# Patient Record
Sex: Female | Born: 1965 | Race: Black or African American | Hispanic: No | Marital: Married | Smoking: Never smoker
Health system: Southern US, Community
[De-identification: ages and names within clinical notes are randomized; demographics above are authoritative.]

## PROBLEM LIST (undated history)

## (undated) DIAGNOSIS — G4730 Sleep apnea, unspecified: Secondary | ICD-10-CM

## (undated) DIAGNOSIS — G4700 Insomnia, unspecified: Secondary | ICD-10-CM

## (undated) DIAGNOSIS — Z9889 Other specified postprocedural states: Secondary | ICD-10-CM

## (undated) DIAGNOSIS — I10 Essential (primary) hypertension: Secondary | ICD-10-CM

## (undated) DIAGNOSIS — F419 Anxiety disorder, unspecified: Secondary | ICD-10-CM

## (undated) HISTORY — DX: Insomnia, unspecified: G47.00

## (undated) HISTORY — DX: Sleep apnea, unspecified: G47.30

## (undated) HISTORY — PX: TONSILLECTOMY: SUR1361

## (undated) HISTORY — DX: Anxiety disorder, unspecified: F41.9

## (undated) HISTORY — DX: Essential (primary) hypertension: I10

---

## 2003-07-03 HISTORY — PX: CHOLECYSTECTOMY: SHX55

## 2003-07-03 HISTORY — PX: GASTRIC BYPASS: SHX52

## 2008-07-02 HISTORY — PX: OTHER SURGICAL HISTORY: SHX169

## 2017-02-19 DIAGNOSIS — D123 Benign neoplasm of transverse colon: Secondary | ICD-10-CM | POA: Diagnosis not present

## 2017-12-06 ENCOUNTER — Encounter: Payer: Self-pay | Admitting: Gastroenterology

## 2017-12-06 DIAGNOSIS — K5900 Constipation, unspecified: Secondary | ICD-10-CM | POA: Diagnosis not present

## 2017-12-06 DIAGNOSIS — I10 Essential (primary) hypertension: Secondary | ICD-10-CM | POA: Diagnosis not present

## 2017-12-06 DIAGNOSIS — G4700 Insomnia, unspecified: Secondary | ICD-10-CM | POA: Diagnosis not present

## 2017-12-06 DIAGNOSIS — F320 Major depressive disorder, single episode, mild: Secondary | ICD-10-CM | POA: Diagnosis not present

## 2017-12-06 DIAGNOSIS — M2011 Hallux valgus (acquired), right foot: Secondary | ICD-10-CM | POA: Diagnosis not present

## 2017-12-16 ENCOUNTER — Encounter: Payer: Self-pay | Admitting: Internal Medicine

## 2018-01-01 DIAGNOSIS — G4700 Insomnia, unspecified: Secondary | ICD-10-CM | POA: Diagnosis not present

## 2018-01-01 DIAGNOSIS — K5900 Constipation, unspecified: Secondary | ICD-10-CM | POA: Diagnosis not present

## 2018-01-01 DIAGNOSIS — I10 Essential (primary) hypertension: Secondary | ICD-10-CM | POA: Diagnosis not present

## 2018-01-01 DIAGNOSIS — M2011 Hallux valgus (acquired), right foot: Secondary | ICD-10-CM | POA: Diagnosis not present

## 2018-01-03 ENCOUNTER — Other Ambulatory Visit (HOSPITAL_BASED_OUTPATIENT_CLINIC_OR_DEPARTMENT_OTHER): Payer: Self-pay

## 2018-01-03 DIAGNOSIS — G4700 Insomnia, unspecified: Secondary | ICD-10-CM

## 2018-01-03 DIAGNOSIS — R0683 Snoring: Secondary | ICD-10-CM

## 2018-03-18 ENCOUNTER — Ambulatory Visit: Payer: Self-pay | Admitting: Gastroenterology

## 2018-04-01 DIAGNOSIS — Z0000 Encounter for general adult medical examination without abnormal findings: Secondary | ICD-10-CM | POA: Diagnosis not present

## 2018-04-01 DIAGNOSIS — Z23 Encounter for immunization: Secondary | ICD-10-CM | POA: Diagnosis not present

## 2018-06-09 ENCOUNTER — Telehealth: Payer: Self-pay | Admitting: *Deleted

## 2018-06-09 ENCOUNTER — Encounter

## 2018-06-09 ENCOUNTER — Encounter: Payer: Self-pay | Admitting: Gastroenterology

## 2018-06-09 ENCOUNTER — Encounter: Payer: Self-pay | Admitting: *Deleted

## 2018-06-09 ENCOUNTER — Ambulatory Visit: Payer: Federal, State, Local not specified - PPO | Admitting: Gastroenterology

## 2018-06-09 DIAGNOSIS — R143 Flatulence: Secondary | ICD-10-CM | POA: Diagnosis not present

## 2018-06-09 DIAGNOSIS — Z8371 Family history of colonic polyps: Secondary | ICD-10-CM

## 2018-06-09 DIAGNOSIS — Z800 Family history of malignant neoplasm of digestive organs: Secondary | ICD-10-CM | POA: Insufficient documentation

## 2018-06-09 DIAGNOSIS — Z1211 Encounter for screening for malignant neoplasm of colon: Secondary | ICD-10-CM

## 2018-06-09 MED ORDER — PEG 3350-KCL-NA BICARB-NACL 420 G PO SOLR: 4000.0000 mL | Freq: Once | ORAL | 0 refills | Status: AC

## 2018-06-09 NOTE — Patient Instructions (Signed)
Colonoscopy as scheduled. See separate instructions.   Make sure you have a few good bowel movements the week before your bowel prep starts. Use OTC laxative if needed.   Limit foods from the HIGH FODMAP food list provided. These foods can increase gas in your digestive system.

## 2018-06-09 NOTE — Progress Notes (Signed)
Primary Care Physician:  Bland, Veita, MD  Primary Gastroenterologist:  Michael Rourk, MD   Chief Complaint  Patient presents with  . Colonoscopy    ref by DR. BLAND, NEVER HAD A TCS. Hx of constipation and hx of Gastric Bypass     HPI:  Courtney Curry is a 52 y.o. female here at the request of Dr. Bland for colonoscopy.  She has never had a colonoscopy.  She has a history of constipation.  Mother and sister both have had colon polyps.  Maternal grandfather died with colon cancer at an advanced age.  Lately she has not had any problems with constipation.  Generally when she does she will take a Dulcolax after 2 to 3 days without a stool.  May be something she does once or twice a month.  More of a prevalent issue since her gastric bypass.  Denies any melena or rectal bleeding.  No upper GI symptoms.  Complains of increased gas.    Current Outpatient Medications  Medication Sig Dispense Refill  . Cyanocobalamin (HM SUPER VITAMIN B12) 2500 MCG CHEW Chew by mouth.    . lisinopril-hydrochlorothiazide (PRINZIDE,ZESTORETIC) 20-12.5 MG tablet Take 1 tablet by mouth daily.    . Nutritional Supp - Diet Aids (DIETERS DETOX PO) Take 1 tablet by mouth once.     No current facility-administered medications for this visit.     Allergies as of 06/09/2018 - Review Complete 06/09/2018  Allergen Reaction Noted  . Penicillins Rash 06/09/2018    Past Medical History:  Diagnosis Date  . Anxiety   . Hypertension   . Insomnia   . Sleep apnea     Past Surgical History:  Procedure Laterality Date  . CHOLECYSTECTOMY  2005   repaired hernia   . GASTRIC BYPASS  2005  . Gastric bypass revision  2010    Family History  Problem Relation Age of Onset  . Colon polyps Mother   . Colon polyps Sister        around age 60  . Colon cancer Maternal Grandfather        older than 60  . Heart attack Maternal Grandfather        from chemo  . Endometrial cancer Sister     Social History    Socioeconomic History  . Marital status: Married    Spouse name: Not on file  . Number of children: Not on file  . Years of education: Not on file  . Highest education level: Not on file  Occupational History  . Not on file  Social Needs  . Financial resource strain: Not on file  . Food insecurity:    Worry: Not on file    Inability: Not on file  . Transportation needs:    Medical: Not on file    Non-medical: Not on file  Tobacco Use  . Smoking status: Never Smoker  . Smokeless tobacco: Never Used  Substance and Sexual Activity  . Alcohol use: Yes    Comment: 2-3 TIMES A WEEK  . Drug use: Never  . Sexual activity: Yes    Partners: Male  Lifestyle  . Physical activity:    Days per week: Not on file    Minutes per session: Not on file  . Stress: Not on file  Relationships  . Social connections:    Talks on phone: Not on file    Gets together: Not on file    Attends religious service: Not on file    Active   member of club or organization: Not on file    Attends meetings of clubs or organizations: Not on file    Relationship status: Not on file  . Intimate partner violence:    Fear of current or ex partner: Not on file    Emotionally abused: Not on file    Physically abused: Not on file    Forced sexual activity: Not on file  Other Topics Concern  . Not on file  Social History Narrative  . Not on file      ROS:  General: Negative for anorexia, weight loss, fever, chills, fatigue, weakness. Eyes: Negative for vision changes.  ENT: Negative for hoarseness, difficulty swallowing , nasal congestion. CV: Negative for chest pain, angina, palpitations, dyspnea on exertion, peripheral edema.  Respiratory: Negative for dyspnea at rest, dyspnea on exertion, cough, sputum, wheezing.  GI: See history of present illness. GU:  Negative for dysuria, hematuria, urinary incontinence, urinary frequency, nocturnal urination.  MS: Negative for joint pain, low back pain.  Derm:  Negative for rash or itching.  Neuro: Negative for weakness, abnormal sensation, seizure, frequent headaches, memory loss, confusion.  Psych: Negative for anxiety, depression, suicidal ideation, hallucinations.  Endo: Negative for unusual weight change.  Heme: Negative for bruising or bleeding. Allergy: Negative for rash or hives.    Physical Examination:  BP 118/75   Pulse 71   Temp (!) 97.5 F (36.4 C) (Oral)   Ht 5' 5" (1.651 m)   Wt 200 lb 3.2 oz (90.8 kg)   BMI 33.32 kg/m    General: Well-nourished, well-developed in no acute distress.  Head: Normocephalic, atraumatic.   Eyes: Conjunctiva pink, no icterus. Mouth: Oropharyngeal mucosa moist and pink , no lesions erythema or exudate. Neck: Supple without thyromegaly, masses, or lymphadenopathy.  Lungs: Clear to auscultation bilaterally.  Heart: Regular rate and rhythm, no murmurs rubs or gallops.  Abdomen: Bowel sounds are normal, nontender, nondistended, no hepatosplenomegaly or masses, no abdominal bruits or    hernia , no rebound or guarding.   Rectal: deferred Extremities: No lower extremity edema. No clubbing or deformities.  Neuro: Alert and oriented x 4 , grossly normal neurologically.  Skin: Warm and dry, no rash or jaundice.   Psych: Alert and cooperative, normal mood and affect.  Labs: June 2019 Glucose 87, BUN 17, creatinine 1.02, TSH 1.84, total bilirubin 1.1, alkaline phosphatase 80, AST 22, ALT 12, albumin 4.5, hemoglobin A1c 5, hemoglobin 12.6, hematocrit 38.1, white blood cell count 4200, platelets 186,000.  Imaging Studies: No results found.   

## 2018-06-09 NOTE — H&P (View-Only) (Signed)
Primary Care Physician:  Renaye RakersBland, Veita, MD  Primary Gastroenterologist:  Roetta SessionsMichael Rourk, MD   Chief Complaint  Patient presents with  . Colonoscopy    ref by DR. BLAND, NEVER HAD A TCS. Hx of constipation and hx of Gastric Bypass     HPI:  Courtney Curry is a [age] y.o. female here at the request of Dr. Parke SimmersBland for colonoscopy.  She has never had a colonoscopy.  She has a history of constipation.  Mother and sister both have had colon polyps.  Maternal grandfather died with colon cancer at an advanced age.  Lately she has not had any problems with constipation.  Generally when she does she will take a Dulcolax after 2 to 3 days without a stool.  May be something she does once or twice a month.  More of a prevalent issue since her gastric bypass.  Denies any melena or rectal bleeding.  No upper GI symptoms.  Complains of increased gas.    Current Outpatient Medications  Medication Sig Dispense Refill  . Cyanocobalamin (HM SUPER VITAMIN B12) 2500 MCG CHEW Chew by mouth.    Marland Kitchen. lisinopril-hydrochlorothiazide (PRINZIDE,ZESTORETIC) 20-12.5 MG tablet Take 1 tablet by mouth daily.    . Nutritional Supp - Diet Aids (DIETERS DETOX PO) Take 1 tablet by mouth once.     No current facility-administered medications for this visit.     Allergies as of 06/09/2018 - Review Complete 06/09/2018  Allergen Reaction Noted  . Penicillins Rash 06/09/2018    Past Medical History:  Diagnosis Date  . Anxiety   . Hypertension   . Insomnia   . Sleep apnea     Past Surgical History:  Procedure Laterality Date  . CHOLECYSTECTOMY  2005   repaired hernia   . GASTRIC BYPASS  2005  . Gastric bypass revision  2010    Family History  Problem Relation Age of Onset  . Colon polyps Mother   . Colon polyps Sister        around age 54  . Colon cancer Maternal Grandfather        older than 60  . Heart attack Maternal Grandfather        from chemo  . Endometrial cancer Sister     Social History    Socioeconomic History  . Marital status: Married    Spouse name: Not on file  . Number of children: Not on file  . Years of education: Not on file  . Highest education level: Not on file  Occupational History  . Not on file  Social Needs  . Financial resource strain: Not on file  . Food insecurity:    Worry: Not on file    Inability: Not on file  . Transportation needs:    Medical: Not on file    Non-medical: Not on file  Tobacco Use  . Smoking status: Never Smoker  . Smokeless tobacco: Never Used  Substance and Sexual Activity  . Alcohol use: Yes    Comment: 2-3 TIMES A WEEK  . Drug use: Never  . Sexual activity: Yes    Partners: Male  Lifestyle  . Physical activity:    Days per week: Not on file    Minutes per session: Not on file  . Stress: Not on file  Relationships  . Social connections:    Talks on phone: Not on file    Gets together: Not on file    Attends religious service: Not on file    Active  member of club or organization: Not on file    Attends meetings of clubs or organizations: Not on file    Relationship status: Not on file  . Intimate partner violence:    Fear of current or ex partner: Not on file    Emotionally abused: Not on file    Physically abused: Not on file    Forced sexual activity: Not on file  Other Topics Concern  . Not on file  Social History Narrative  . Not on file      ROS:  General: Negative for anorexia, weight loss, fever, chills, fatigue, weakness. Eyes: Negative for vision changes.  ENT: Negative for hoarseness, difficulty swallowing , nasal congestion. CV: Negative for chest pain, angina, palpitations, dyspnea on exertion, peripheral edema.  Respiratory: Negative for dyspnea at rest, dyspnea on exertion, cough, sputum, wheezing.  GI: See history of present illness. GU:  Negative for dysuria, hematuria, urinary incontinence, urinary frequency, nocturnal urination.  MS: Negative for joint pain, low back pain.  Derm:  Negative for rash or itching.  Neuro: Negative for weakness, abnormal sensation, seizure, frequent headaches, memory loss, confusion.  Psych: Negative for anxiety, depression, suicidal ideation, hallucinations.  Endo: Negative for unusual weight change.  Heme: Negative for bruising or bleeding. Allergy: Negative for rash or hives.    Physical Examination:  BP 118/75   Pulse 71   Temp (!) 97.5 F (36.4 C) (Oral)   Ht 5\' 5"  (1.651 m)   Wt 200 lb 3.2 oz (90.8 kg)   BMI 33.32 kg/m    General: Well-nourished, well-developed in no acute distress.  Head: Normocephalic, atraumatic.   Eyes: Conjunctiva pink, no icterus. Mouth: Oropharyngeal mucosa moist and pink , no lesions erythema or exudate. Neck: Supple without thyromegaly, masses, or lymphadenopathy.  Lungs: Clear to auscultation bilaterally.  Heart: Regular rate and rhythm, no murmurs rubs or gallops.  Abdomen: Bowel sounds are normal, nontender, nondistended, no hepatosplenomegaly or masses, no abdominal bruits or    hernia , no rebound or guarding.   Rectal: deferred Extremities: No lower extremity edema. No clubbing or deformities.  Neuro: Alert and oriented x 4 , grossly normal neurologically.  Skin: Warm and dry, no rash or jaundice.   Psych: Alert and cooperative, normal mood and affect.  Labs: June 2019 Glucose 87, BUN 17, creatinine 1.02, TSH 1.84, total bilirubin 1.1, alkaline phosphatase 80, AST 22, ALT 12, albumin 4.5, hemoglobin A1c 5, hemoglobin 12.6, hematocrit 38.1, white blood cell count 4200, platelets 186,000.  Imaging Studies: No results found.

## 2018-06-09 NOTE — Telephone Encounter (Signed)
Pre-op scheduled for 06/27/18 at 2:15pm. Letter mailed. Called patient, someone answered and then hung up.

## 2018-06-12 ENCOUNTER — Encounter: Payer: Self-pay | Admitting: Gastroenterology

## 2018-06-12 NOTE — Assessment & Plan Note (Signed)
Family history of colon polyps, mother and sister, maternal grandfather had colon cancer at an advanced age.  Patient has never had a colonoscopy.  Given history of daily alcohol use, anxiety, plan for deep sedation.  I have discussed the risks, alternatives, benefits with regards to but not limited to the risk of reaction to medication, bleeding, infection, perforation and the patient is agreeable to proceed. Written consent to be obtained.

## 2018-06-12 NOTE — Progress Notes (Signed)
CC'D TO PCP °

## 2018-06-12 NOTE — Assessment & Plan Note (Signed)
Limit foods from the high FODMAP diet.

## 2018-06-17 NOTE — Patient Instructions (Signed)
Courtney Shropshirengela Knight Curry  06/17/2018     @PREFPERIOPPHARMACY @   Your procedure is scheduled on  07/03/2018 .  [facility] to [facility] at  1215  P.M.  Call this number if you have problems the morning of surgery:  [phone]   Remember:  Follow the diet and prep instructions given to you by Dr Luvenia Starchourk's office.                      Take these medicines the morning of surgery with A SIP OF WATER Lisinopril.    Do not wear jewelry, make-up or nail polish.  Do not wear lotions, powders, or perfumes, or deodorant.  Do not shave 48 hours prior to surgery.  Men may shave face and neck.  Do not bring valuables to the hospital.  [facility] any belongings or valuables.  Contacts, dentures or bridgework may not be worn into surgery.  Leave your suitcase in the car.  After surgery it may be brought to your room.  For patients admitted to the hospital, discharge time will be determined by your treatment team.  Patients discharged the day of surgery will not be allowed to drive home.   Name and phone number of your driver:   family Special instructions:  None  Please read over the following fact sheets that you were given. Anesthesia Post-op Instructions and Care and Recovery After Surgery       Colonoscopy, Adult A colonoscopy is an exam to look at the large intestine. It is done to check for problems, such as:  Lumps (tumors).  Growths (polyps).  Swelling (inflammation).  Bleeding.  What happens before the procedure? Eating and drinking Follow instructions from your doctor about eating and drinking. These instructions may include:  A few days before the procedure - follow a low-fiber diet. ? Avoid nuts. ? Avoid seeds. ? Avoid dried fruit. ? Avoid raw fruits. ? Avoid vegetables.  1-3 days before the procedure - follow a clear liquid diet. Avoid liquids that have red or purple dye. Drink only clear liquids, such as: ? Clear broth or  bouillon. ? Black coffee or tea. ? Clear juice. ? Clear soft drinks or sports drinks. ? Gelatin dessert. ? Popsicles.  On the day of the procedure - do not eat or drink anything during the 2 hours before the procedure.  Bowel prep If you were prescribed an oral bowel prep:  Take it as told by your doctor. Starting the day before your procedure, you will need to drink a lot of liquid. The liquid will cause you to poop (have bowel movements) until your poop is almost clear or light green.  If your skin or butt gets irritated from diarrhea, you may: ? Wipe the area with wipes that have medicine in them, such as adult wet wipes with aloe and vitamin E. ? Put something on your skin that soothes the area, such as petroleum jelly.  If you throw up (vomit) while drinking the bowel prep, take a break for up to 60 minutes. Then begin the bowel prep again. If you keep throwing up and you cannot take the bowel prep without throwing up, call your doctor.  General instructions  Ask your doctor about changing or stopping your normal medicines. This is important if you take diabetes medicines or blood thinners.  Plan to have someone take you home from the hospital or clinic.  What happens during the procedure?  An IV tube may be put into one of your veins.  You will be given medicine to help you relax (sedative).  To reduce your risk of infection: ? Your doctors will wash their hands. ? Your anal area will be washed with soap.  You will be asked to lie on your side with your knees bent.  Your doctor will get a long, thin, flexible tube ready. The tube will have a camera and a light on the end.  The tube will be put into your anus.  The tube will be gently put into your large intestine.  Air will be delivered into your large intestine to keep it open. You may feel some pressure or cramping.  The camera will be used to take photos.  A small tissue sample may be removed from your body to  be looked at under a microscope (biopsy). If any possible problems are found, the tissue will be sent to a lab for testing.  If small growths are found, your doctor may remove them and have them checked for cancer.  The tube that was put into your anus will be slowly removed. The procedure may vary among doctors and hospitals. What happens after the procedure?  Your doctor will check on you often until the medicines you were given have worn off.  Do not drive for 24 hours after the procedure.  You may have a small amount of blood in your poop.  You may pass gas.  You may have mild cramps or bloating in your belly (abdomen).  It is up to you to get the results of your procedure. Ask your doctor, or the department performing the procedure, when your results will be ready. This information is not intended to replace advice given to you by your health care provider. Make sure you discuss any questions you have with your health care provider. Document Released: 07/21/2010 Document Revised: 04/18/2016 Document Reviewed: 08/30/2015 Elsevier Interactive Patient Education  2017 [employer]  Colonoscopy, Adult, Care After This sheet gives you information about how to care for yourself after your procedure. Your health care provider may also give you more specific instructions. If you have problems or questions, contact your health care provider. What can I expect after the procedure? After the procedure, it is common to have:  A small amount of blood in your stool for 24 hours after the procedure.  Some gas.  Mild abdominal cramping or bloating.  Follow these instructions at home: General instructions   For the first 24 hours after the procedure: ? Do not drive or use machinery. ? Do not sign important documents. ? Do not drink alcohol. ? Do your regular daily activities at a slower pace than normal. ? Eat soft, easy-to-digest foods. ? Rest often.  Take over-the-counter or  prescription medicines only as told by your health care provider.  It is up to you to get the results of your procedure. Ask your health care provider, or the department performing the procedure, when your results will be ready. Relieving cramping and bloating  Try walking around when you have cramps or feel bloated.  Apply heat to your abdomen as told by your health care provider. Use a heat source that your health care provider recommends, such as a moist heat pack or a heating pad. ? Place a towel between your skin and the heat source. ? Leave the heat on for 20-30 minutes. ? Remove the heat if your  skin turns bright red. This is especially important if you are unable to feel pain, heat, or cold. You may have a greater risk of getting burned. Eating and drinking  Drink enough fluid to keep your urine clear or pale yellow.  Resume your normal diet as instructed by your health care provider. Avoid heavy or fried foods that are hard to digest.  Avoid drinking alcohol for as long as instructed by your health care provider. Contact a health care provider if:  You have blood in your stool 2-3 days after the procedure. Get help right away if:  You have more than a small spotting of blood in your stool.  You pass large blood clots in your stool.  Your abdomen is swollen.  You have nausea or vomiting.  You have a fever.  You have increasing abdominal pain that is not relieved with medicine. This information is not intended to replace advice given to you by your health care provider. Make sure you discuss any questions you have with your health care provider. Document Released: 01/31/2004 Document Revised: 03/12/2016 Document Reviewed: 08/30/2015 Elsevier Interactive Patient Education  2018 [place] Anesthesia is a term that refers to techniques, procedures, and medicines that help a person stay safe and comfortable during a medical procedure.  Monitored anesthesia care, or sedation, is one type of anesthesia. Your anesthesia specialist may recommend sedation if you will be having a procedure that does not require you to be unconscious, such as:  Cataract surgery.  A dental procedure.  A biopsy.  A colonoscopy.  During the procedure, you may receive a medicine to help you relax (sedative). There are three levels of sedation:  Mild sedation. At this level, you may feel awake and relaxed. You will be able to follow directions.  Moderate sedation. At this level, you will be sleepy. You may not remember the procedure.  Deep sedation. At this level, you will be asleep. You will not remember the procedure.  The more medicine you are given, the deeper your level of sedation will be. Depending on how you respond to the procedure, the anesthesia specialist may change your level of sedation or the type of anesthesia to fit your needs. An anesthesia specialist will monitor you closely during the procedure. Let your health care provider know about:  Any allergies you have.  All medicines you are taking, including vitamins, herbs, eye drops, creams, and over-the-counter medicines.  Any use of steroids (by mouth or as a cream).  Any problems you or family members have had with sedatives and anesthetic medicines.  Any blood disorders you have.  Any surgeries you have had.  Any medical conditions you have, such as sleep apnea.  Whether you are pregnant or may be pregnant.  Any use of cigarettes, alcohol, or street drugs. What are the risks? Generally, this is a safe procedure. However, problems may occur, including:  Getting too much medicine (oversedation).  Nausea.  Allergic reaction to medicines.  Trouble breathing. If this happens, a breathing tube may be used to help with breathing. It will be removed when you are awake and breathing on your own.  Heart trouble.  Lung trouble.  Before the procedure Staying  hydrated Follow instructions from your health care provider about hydration, which may include:  Up to 2 hours before the procedure - you may continue to drink clear liquids, such as water, clear fruit juice, black coffee, and plain tea.  Eating and drinking restrictions Follow  instructions from your health care provider about eating and drinking, which may include:  8 hours before the procedure - stop eating heavy meals or foods such as meat, fried foods, or fatty foods.  6 hours before the procedure - stop eating light meals or foods, such as toast or cereal.  6 hours before the procedure - stop drinking milk or drinks that contain milk.  2 hours before the procedure - stop drinking clear liquids.  Medicines Ask your health care provider about:  Changing or stopping your regular medicines. This is especially important if you are taking diabetes medicines or blood thinners.  Taking medicines such as aspirin and ibuprofen. These medicines can thin your blood. Do not take these medicines before your procedure if your health care provider instructs you not to.  Tests and exams  You will have a physical exam.  You may have blood tests done to show: ? How well your kidneys and liver are working. ? How well your blood can clot.  General instructions  Plan to have someone take you home from the hospital or clinic.  If you will be going home right after the procedure, plan to have someone with you for 24 hours.  What happens during the procedure?  Your blood pressure, heart rate, breathing, level of pain and overall condition will be monitored.  An IV tube will be inserted into one of your veins.  Your anesthesia specialist will give you medicines as needed to keep you comfortable during the procedure. This may mean changing the level of sedation.  The procedure will be performed. After the procedure  Your blood pressure, heart rate, breathing rate, and blood oxygen level  will be monitored until the medicines you were given have worn off.  Do not drive for 24 hours if you received a sedative.  You may: ? Feel sleepy, clumsy, or nauseous. ? Feel forgetful about what happened after the procedure. ? Have a sore throat if you had a breathing tube during the procedure. ? Vomit. This information is not intended to replace advice given to you by your health care provider. Make sure you discuss any questions you have with your health care provider. Document Released: 03/14/2005 Document Revised: 11/25/2015 Document Reviewed: 10/09/2015 Elsevier Interactive Patient Education  2018 [place], Care After These instructions provide you with information about caring for yourself after your procedure. Your health care provider may also give you more specific instructions. Your treatment has been planned according to current medical practices, but problems sometimes occur. Call your health care provider if you have any problems or questions after your procedure. What can I expect after the procedure? After your procedure, it is common to:  Feel sleepy for several hours.  Feel clumsy and have poor balance for several hours.  Feel forgetful about what happened after the procedure.  Have poor judgment for several hours.  Feel nauseous or vomit.  Have a sore throat if you had a breathing tube during the procedure.  Follow these instructions at home: For at least 24 hours after the procedure:   Do not: ? Participate in activities in which you could fall or become injured. ? Drive. ? Use heavy machinery. ? Drink alcohol. ? Take sleeping pills or medicines that cause drowsiness. ? Make important decisions or sign legal documents. ? Take care of children on your own.  Rest. Eating and drinking  Follow the diet that is recommended by your health care provider.  If  you vomit, drink water, juice, or soup when you can drink without  vomiting.  Make sure you have little or no nausea before eating solid foods. General instructions  Have a responsible adult stay with you until you are awake and alert.  Take over-the-counter and prescription medicines only as told by your health care provider.  If you smoke, do not smoke without supervision.  Keep all follow-up visits as told by your health care provider. This is important. Contact a health care provider if:  You keep feeling nauseous or you keep vomiting.  You feel light-headed.  You develop a rash.  You have a fever. Get help right away if:  You have trouble breathing. This information is not intended to replace advice given to you by your health care provider. Make sure you discuss any questions you have with your health care provider. Document Released: 10/09/2015 Document Revised: 02/08/2016 Document Reviewed: 10/09/2015 Elsevier Interactive Patient Education  [employer].

## 2018-06-27 ENCOUNTER — Encounter (HOSPITAL_COMMUNITY): Payer: Self-pay

## 2018-06-27 ENCOUNTER — Other Ambulatory Visit: Payer: Self-pay

## 2018-06-27 ENCOUNTER — Encounter (HOSPITAL_COMMUNITY)
Admission: RE | Admit: 2018-06-27 | Discharge: 2018-06-27 | Disposition: A | Discharge status: Home / Self Care | Payer: Federal, State, Local not specified - PPO | Source: Ambulatory Visit | Attending: Internal Medicine | Admitting: Internal Medicine

## 2018-06-27 DIAGNOSIS — Z01812 Encounter for preprocedural laboratory examination: Secondary | ICD-10-CM | POA: Diagnosis not present

## 2018-06-27 LAB — BASIC METABOLIC PANEL
Anion gap: 6 (ref 5–15)
BUN: 22 mg/dL — ABNORMAL HIGH (ref 6–20)
CHLORIDE: 108 mmol/L (ref 98–111)
CO2: 24 mmol/L (ref 22–32)
Calcium: 8.7 mg/dL — ABNORMAL LOW (ref 8.9–10.3)
Creatinine, Ser: 0.99 mg/dL (ref 0.44–1.00)
GFR calc non Af Amer: >60 mL/min (ref >60)
Glucose, Bld: 112 mg/dL — ABNORMAL HIGH (ref 70–99)
Potassium: 3.7 mmol/L (ref 3.5–5.1)
Sodium: 138 mmol/L (ref 135–145)

## 2018-06-27 LAB — HCG, SERUM, QUALITATIVE: Preg, Serum: NEGATIVE

## 2018-07-03 ENCOUNTER — Ambulatory Visit (HOSPITAL_COMMUNITY)
Admission: RE | Admit: 2018-07-03 | Discharge: 2018-07-03 | Disposition: A | Discharge status: Home / Self Care | Payer: Federal, State, Local not specified - PPO | Attending: Internal Medicine | Admitting: Internal Medicine

## 2018-07-03 ENCOUNTER — Encounter (HOSPITAL_COMMUNITY): Payer: Self-pay | Admitting: *Deleted

## 2018-07-03 ENCOUNTER — Ambulatory Visit (HOSPITAL_COMMUNITY): Payer: Federal, State, Local not specified - PPO | Admitting: Anesthesiology

## 2018-07-03 ENCOUNTER — Other Ambulatory Visit: Payer: Self-pay

## 2018-07-03 ENCOUNTER — Encounter (HOSPITAL_COMMUNITY)
Admission: RE | Disposition: A | Discharge status: Home / Self Care | Payer: Self-pay | Source: Home / Self Care | Attending: Internal Medicine

## 2018-07-03 DIAGNOSIS — G4700 Insomnia, unspecified: Secondary | ICD-10-CM | POA: Diagnosis not present

## 2018-07-03 DIAGNOSIS — Z800 Family history of malignant neoplasm of digestive organs: Secondary | ICD-10-CM | POA: Diagnosis not present

## 2018-07-03 DIAGNOSIS — Z8371 Family history of colonic polyps: Secondary | ICD-10-CM | POA: Insufficient documentation

## 2018-07-03 DIAGNOSIS — F419 Anxiety disorder, unspecified: Secondary | ICD-10-CM | POA: Insufficient documentation

## 2018-07-03 DIAGNOSIS — I10 Essential (primary) hypertension: Secondary | ICD-10-CM | POA: Diagnosis not present

## 2018-07-03 DIAGNOSIS — Z1211 Encounter for screening for malignant neoplasm of colon: Secondary | ICD-10-CM | POA: Insufficient documentation

## 2018-07-03 DIAGNOSIS — K5900 Constipation, unspecified: Secondary | ICD-10-CM | POA: Diagnosis not present

## 2018-07-03 DIAGNOSIS — Z9884 Bariatric surgery status: Secondary | ICD-10-CM | POA: Diagnosis not present

## 2018-07-03 DIAGNOSIS — G4730 Sleep apnea, unspecified: Secondary | ICD-10-CM | POA: Diagnosis not present

## 2018-07-03 DIAGNOSIS — Z79899 Other long term (current) drug therapy: Secondary | ICD-10-CM | POA: Diagnosis not present

## 2018-07-03 HISTORY — PX: COLONOSCOPY WITH PROPOFOL: SHX5780

## 2018-07-03 SURGERY — COLONOSCOPY WITH PROPOFOL
Anesthesia: General

## 2018-07-03 MED ORDER — LACTATED RINGERS IV SOLN
INTRAVENOUS | Status: DC
  Administered 2018-07-03: 13:00:00 via INTRAVENOUS

## 2018-07-03 MED ORDER — PROPOFOL 10 MG/ML IV BOLUS
INTRAVENOUS | Status: AC
  Filled 2018-07-03: qty 60

## 2018-07-03 MED ORDER — PROPOFOL 10 MG/ML IV BOLUS
INTRAVENOUS | Status: DC | PRN
  Administered 2018-07-03: 80 mg via INTRAVENOUS

## 2018-07-03 MED ORDER — CHLORHEXIDINE GLUCONATE CLOTH 2 % EX PADS: 6.0000 | MEDICATED_PAD | Freq: Once | CUTANEOUS | Status: DC

## 2018-07-03 MED ORDER — PROPOFOL 500 MG/50ML IV EMUL
INTRAVENOUS | Status: DC | PRN
  Administered 2018-07-03: 200 ug/kg/min via INTRAVENOUS
  Administered 2018-07-03: 150 ug/kg/min via INTRAVENOUS

## 2018-07-03 MED ORDER — PROPOFOL 10 MG/ML IV BOLUS
INTRAVENOUS | Status: AC
  Filled 2018-07-03: qty 20

## 2018-07-03 NOTE — Interval H&P Note (Signed)
History and Physical Interval Note:  07/03/2018 1:22 PM  Kiersti Grandpre Knight-Davis  has presented today for surgery, with the diagnosis of colon cancer screening, FH colon polyps  The various methods of treatment have been discussed with the patient and family. After consideration of risks, benefits and other options for treatment, the patient has consented to  Procedure(s) with comments: COLONOSCOPY WITH PROPOFOL (N/A) - 1:45pm as a surgical intervention .  The patient's history has been reviewed, patient examined, no change in status, stable for surgery.  I have reviewed the patient's chart and labs.  Questions were answered to the patient's satisfaction.     Miamarie Moll  No change.  Patient presents for first ever however screening colonoscopy.  The risks, benefits, limitations, alternatives and imponderables have been reviewed with the patient. Questions have been answered. All parties are agreeable.

## 2018-07-03 NOTE — Op Note (Signed)
[facility] Patient Name: Courtney Curry Procedure Date: 07/03/2018 1:27 PM MRN: [id number] Date of Birth: [birth year] Attending MD: Gennette Pac , MD CSN: [id number] Age: 53 Admit Type: Outpatient Procedure:                Colonoscopy Indications:              Colon cancer screening in patient at increased                            risk: Family history of colon polyps in multiple                            1st-degree relatives Providers:                Gennette Pac, MD, Jannett Celestine, RN, Edythe Clarity, Technician Referring MD:              Medicines:                Propofol per Anesthesia Complications:            No immediate complications. Estimated Blood Loss:     Estimated blood loss: none. Procedure:                Pre-Anesthesia Assessment:                           - Prior to the procedure, a History and Physical                            was performed, and patient medications and                            allergies were reviewed. The patient's tolerance of                            previous anesthesia was also reviewed. The risks                            and benefits of the procedure and the sedation                            options and risks were discussed with the patient.                            All questions were answered, and informed consent                            was obtained. Prior Anticoagulants: The patient has                            taken no previous anticoagulant or antiplatelet                            agents.  ASA Grade Assessment: II - A patient with                            mild systemic disease. After reviewing the risks                            and benefits, the patient was deemed in                            satisfactory condition to undergo the procedure.                           After obtaining informed consent, the colonoscope                            was passed under direct  vision. Throughout the                            procedure, the patient's blood pressure, pulse, and                            oxygen saturations were monitored continuously. The                            CF-[id number] ([id number] scope was introduced through                            the and advanced to the the cecum, identified by                            appendiceal orifice and ileocecal valve. The                            colonoscopy was performed without difficulty. The                            patient tolerated the procedure well. The quality                            of the bowel preparation was adequate. The                            ileocecal valve, appendiceal orifice, and rectum                            were photographed. The entire colon was well                            visualized. Scope In: 1:33:58 PM Scope Out: 1:49:18 PM Scope Withdrawal Time: 0 hours 7 minutes 45 seconds  Total Procedure Duration: 0 hours 15 minutes 20 seconds  Findings:      The perianal and digital rectal examinations were normal.      The colon (entire examined portion) appeared normal.  The retroflexed view of the distal rectum and anal verge was normal and       showed no anal or rectal abnormalities. Impression:               - The entire examined colon is normal.                           - The distal rectum and anal verge are normal on                            retroflexion view.                           - No specimens collected. Moderate Sedation:      Moderate (conscious) sedation was personally administered by an       anesthesia professional. The following parameters were monitored: oxygen       saturation, heart rate, blood pressure, respiratory rate, EKG, adequacy       of pulmonary ventilation, and response to care. Recommendation:           - Patient has a contact number available for                            emergencies. The signs and symptoms of potential                             delayed complications were discussed with the                            patient. Return to normal activities tomorrow.                            Written discharge instructions were provided to the                            patient.                           - Advance diet as tolerated.                           - Continue present medications.                           - Repeat colonoscopy in 5 years for screening                            purposes.                           - Return to GI office PRN. Procedure Code(s):        --- Professional ---                           [phone], Colonoscopy, flexible; diagnostic, including  collection of specimen(s) by brushing or washing,                            when performed (separate procedure) Diagnosis Code(s):        --- Professional ---                           Z83.71, Family history of colonic polyps CPT copyright 2018 American Medical Association. All rights reserved. The codes documented in this report are preliminary and upon coder review may  be revised to meet current compliance requirements. [name], MD Gennette Pacobert Michael Zondra Lawlor, MD 07/03/2018 1:55:42 PM This report has been signed electronically. Number of Addenda: 0

## 2018-07-03 NOTE — Anesthesia Procedure Notes (Signed)
Procedure Name: MAC Date/Time: 07/03/2018 1:27 PM Performed by: Vista Deck, CRNA Pre-anesthesia Checklist: Patient identified, Emergency Drugs available, Suction available, Timeout performed and Patient being monitored Patient Re-evaluated:Patient Re-evaluated prior to induction Oxygen Delivery Method: Nasal Cannula

## 2018-07-03 NOTE — Anesthesia Postprocedure Evaluation (Signed)
Anesthesia Post Note  Patient: Courtney Curry  Procedure(s) Performed: COLONOSCOPY WITH PROPOFOL (N/A )  Patient location during evaluation: PACU Anesthesia Type: General Level of consciousness: awake and alert and patient cooperative Pain management: satisfactory to patient Vital Signs Assessment: post-procedure vital signs reviewed and stable Respiratory status: spontaneous breathing Cardiovascular status: stable Postop Assessment: no apparent nausea or vomiting Anesthetic complications: no     Last Vitals:  Vitals:   07/03/18 1227  BP: 119/74  Pulse: 64  Resp: 19  Temp: 36.5 C  SpO2: 100%    Last Pain:  Vitals:   07/03/18 1329  TempSrc:   PainSc: 0-No pain                 Corie Allis

## 2018-07-03 NOTE — Transfer of Care (Signed)
Immediate Anesthesia Transfer of Care Note  Patient: Courtney Curry  Procedure(s) Performed: COLONOSCOPY WITH PROPOFOL (N/A )  Patient Location: PACU  Anesthesia Type:General  Level of Consciousness: awake, alert  and patient cooperative  Airway & Oxygen Therapy: Patient Spontanous Breathing  Post-op Assessment: Report given to RN and Post -op Vital signs reviewed and stable  Post vital signs: Reviewed and stable  Last Vitals:  Vitals Value Taken Time  BP    Temp    Pulse    Resp    SpO2      Last Pain:  Vitals:   07/03/18 1329  TempSrc:   PainSc: 0-No pain      Patients Stated Pain Goal: 6 (07/03/18 1227)  Complications: No apparent anesthesia complications

## 2018-07-03 NOTE — Anesthesia Preprocedure Evaluation (Signed)
Anesthesia Evaluation  Patient identified by MRN, date of birth, ID band Patient awake    Reviewed: Allergy & Precautions, H&P , NPO status , Patient's Chart, lab work & pertinent test results  Airway Mallampati: I  TM Distance: >3 FB Neck ROM: full    Dental no notable dental hx.    Pulmonary sleep apnea ,    Pulmonary exam normal breath sounds clear to auscultation       Cardiovascular Exercise Tolerance: Good hypertension, negative cardio ROS   Rhythm:regular Rate:Normal     Neuro/Psych PSYCHIATRIC DISORDERS Anxiety negative neurological ROS     GI/Hepatic negative GI ROS, Neg liver ROS,   Endo/Other  negative endocrine ROS  Renal/GU negative Renal ROS  negative genitourinary   Musculoskeletal   Abdominal   Peds  Hematology negative hematology ROS (+)   Anesthesia Other Findings   Reproductive/Obstetrics negative OB ROS                             Anesthesia Physical Anesthesia Plan  ASA: II  Anesthesia Plan: General   Post-op Pain Management:    Induction:   PONV Risk Score and Plan:   Airway Management Planned:   Additional Equipment:   Intra-op Plan:   Post-operative Plan:   Informed Consent: I have reviewed the patients History and Physical, chart, labs and discussed the procedure including the risks, benefits and alternatives for the proposed anesthesia with the patient or authorized representative who has indicated his/her understanding and acceptance.     Plan Discussed with: CRNA  Anesthesia Plan Comments:         Anesthesia Quick Evaluation

## 2018-07-03 NOTE — Discharge Instructions (Signed)
Colonoscopy Discharge Instructions  Read the instructions outlined below and refer to this sheet in the next few weeks. These discharge instructions provide you with general information on caring for yourself after you leave the hospital. Your doctor may also give you specific instructions. While your treatment has been planned according to the most current medical practices available, unavoidable complications occasionally occur. If you have any problems or questions after discharge, call Dr. Jena Gauss at [phone]. ACTIVITY  You may resume your regular activity, but move at a slower pace for the next 24 hours.   Take frequent rest periods for the next 24 hours.   Walking will help get rid of the air and reduce the bloated feeling in your belly (abdomen).   No driving for 24 hours (because of the medicine (anesthesia) used during the test).    Do not sign any important legal documents or operate any machinery for 24 hours (because of the anesthesia used during the test).  NUTRITION  Drink plenty of fluids.   You may resume your normal diet as instructed by your doctor.   Begin with a light meal and progress to your normal diet. Heavy or fried foods are harder to digest and may make you feel sick to your stomach (nauseated).   Avoid alcoholic beverages for 24 hours or as instructed.  MEDICATIONS  You may resume your normal medications unless your doctor tells you otherwise.  WHAT YOU CAN EXPECT TODAY  Some feelings of bloating in the abdomen.   Passage of more gas than usual.   Spotting of blood in your stool or on the toilet paper.  IF YOU HAD POLYPS REMOVED DURING THE COLONOSCOPY:  No aspirin products for 7 days or as instructed.   No alcohol for 7 days or as instructed.   Eat a soft diet for the next 24 hours.  FINDING OUT THE RESULTS OF YOUR TEST Not all test results are available during your visit. If your test results are not back during the visit, make an appointment  with your caregiver to find out the results. Do not assume everything is normal if you have not heard from your caregiver or the medical facility. It is important for you to follow up on all of your test results.  SEEK IMMEDIATE MEDICAL ATTENTION IF:  You have more than a spotting of blood in your stool.   Your belly is swollen (abdominal distention).   You are nauseated or vomiting.   You have a temperature over 101.   You have abdominal pain or discomfort that is severe or gets worse throughout the day.   Monitored Anesthesia Care, Care After These instructions provide you with information about caring for yourself after your procedure. Your health care provider may also give you more specific instructions. Your treatment has been planned according to current medical practices, but problems sometimes occur. Call your health care provider if you have any problems or questions after your procedure. What can I expect after the procedure? After your procedure, you may:  Feel sleepy for several hours.  Feel clumsy and have poor balance for several hours.  Feel forgetful about what happened after the procedure.  Have poor judgment for several hours.  Feel nauseous or vomit.  Have a sore throat if you had a breathing tube during the procedure. Follow these instructions at home: For at least 24 hours after the procedure:      Have a responsible adult stay with you. It is important to have  someone help care for you until you are awake and alert.  Rest as needed.  Do not: ? Participate in activities in which you could fall or become injured. ? Drive. ? Use heavy machinery. ? Drink alcohol. ? Take sleeping pills or medicines that cause drowsiness. ? Make important decisions or sign legal documents. ? Take care of children on your own. Eating and drinking  Follow the diet that is recommended by your health care provider.  If you vomit, drink water, juice, or soup when you can  drink without vomiting.  Make sure you have little or no nausea before eating solid foods. General instructions  Take over-the-counter and prescription medicines only as told by your health care provider.  If you have sleep apnea, surgery and certain medicines can increase your risk for breathing problems. Follow instructions from your health care provider about wearing your sleep device: ? Anytime you are sleeping, including during daytime naps. ? While taking prescription pain medicines, sleeping medicines, or medicines that make you drowsy.  If you smoke, do not smoke without supervision.  Keep all follow-up visits as told by your health care provider. This is important. Contact a health care provider if:  You keep feeling nauseous or you keep vomiting.  You feel light-headed.  You develop a rash.  You have a fever. Get help right away if:  You have trouble breathing. Summary  For several hours after your procedure, you may feel sleepy and have poor judgment.  Have a responsible adult stay with you for at least 24 hours or until you are awake and alert. This information is not intended to replace advice given to you by your health care provider. Make sure you discuss any questions you have with your health care provider. Document Released: 10/09/2015 Document Revised: 02/01/2017 Document Reviewed: 10/09/2015 Elsevier Interactive Patient Education  [employer]2019 [employer]   Repeat colonoscopy in 5 years

## 2018-07-09 ENCOUNTER — Encounter (HOSPITAL_COMMUNITY): Payer: Self-pay | Admitting: Internal Medicine

## 2018-08-26 DIAGNOSIS — I10 Essential (primary) hypertension: Secondary | ICD-10-CM | POA: Diagnosis not present

## 2018-08-26 DIAGNOSIS — E538 Deficiency of other specified B group vitamins: Secondary | ICD-10-CM | POA: Diagnosis not present

## 2019-11-17 DIAGNOSIS — R635 Abnormal weight gain: Secondary | ICD-10-CM | POA: Diagnosis not present

## 2019-11-17 DIAGNOSIS — F064 Anxiety disorder due to known physiological condition: Secondary | ICD-10-CM | POA: Diagnosis not present

## 2019-11-17 DIAGNOSIS — G4730 Sleep apnea, unspecified: Secondary | ICD-10-CM | POA: Diagnosis not present

## 2019-11-17 DIAGNOSIS — K5900 Constipation, unspecified: Secondary | ICD-10-CM | POA: Diagnosis not present

## 2019-11-17 DIAGNOSIS — I10 Essential (primary) hypertension: Secondary | ICD-10-CM | POA: Diagnosis not present

## 2019-12-18 ENCOUNTER — Other Ambulatory Visit (HOSPITAL_COMMUNITY): Payer: Self-pay | Admitting: Family Medicine

## 2019-12-18 DIAGNOSIS — Z1231 Encounter for screening mammogram for malignant neoplasm of breast: Secondary | ICD-10-CM

## 2020-02-05 ENCOUNTER — Other Ambulatory Visit: Payer: Self-pay

## 2020-02-05 ENCOUNTER — Ambulatory Visit (HOSPITAL_COMMUNITY)
Admission: RE | Admit: 2020-02-05 | Discharge: 2020-02-05 | Disposition: A | Discharge status: Home / Self Care | Payer: Federal, State, Local not specified - PPO | Source: Ambulatory Visit | Attending: Family Medicine | Admitting: Family Medicine

## 2020-02-05 DIAGNOSIS — Z1231 Encounter for screening mammogram for malignant neoplasm of breast: Secondary | ICD-10-CM | POA: Diagnosis not present

## 2020-02-13 DIAGNOSIS — G4730 Sleep apnea, unspecified: Secondary | ICD-10-CM | POA: Diagnosis not present

## 2020-02-13 DIAGNOSIS — I10 Essential (primary) hypertension: Secondary | ICD-10-CM | POA: Diagnosis not present

## 2020-02-13 DIAGNOSIS — F064 Anxiety disorder due to known physiological condition: Secondary | ICD-10-CM | POA: Diagnosis not present

## 2020-03-18 DIAGNOSIS — F064 Anxiety disorder due to known physiological condition: Secondary | ICD-10-CM | POA: Diagnosis not present

## 2020-03-18 DIAGNOSIS — G4730 Sleep apnea, unspecified: Secondary | ICD-10-CM | POA: Diagnosis not present

## 2020-03-18 DIAGNOSIS — I10 Essential (primary) hypertension: Secondary | ICD-10-CM | POA: Diagnosis not present

## 2020-04-01 DIAGNOSIS — G4733 Obstructive sleep apnea (adult) (pediatric): Secondary | ICD-10-CM | POA: Diagnosis not present

## 2020-05-02 DIAGNOSIS — G4733 Obstructive sleep apnea (adult) (pediatric): Secondary | ICD-10-CM | POA: Diagnosis not present

## 2020-06-01 DIAGNOSIS — G4733 Obstructive sleep apnea (adult) (pediatric): Secondary | ICD-10-CM | POA: Diagnosis not present

## 2020-07-02 DIAGNOSIS — G4733 Obstructive sleep apnea (adult) (pediatric): Secondary | ICD-10-CM | POA: Diagnosis not present

## 2020-07-12 DIAGNOSIS — E6609 Other obesity due to excess calories: Secondary | ICD-10-CM | POA: Diagnosis not present

## 2020-07-12 DIAGNOSIS — G4730 Sleep apnea, unspecified: Secondary | ICD-10-CM | POA: Diagnosis not present

## 2020-07-12 DIAGNOSIS — I10 Essential (primary) hypertension: Secondary | ICD-10-CM | POA: Diagnosis not present

## 2020-08-02 DIAGNOSIS — G4733 Obstructive sleep apnea (adult) (pediatric): Secondary | ICD-10-CM | POA: Diagnosis not present

## 2020-08-30 DIAGNOSIS — G4733 Obstructive sleep apnea (adult) (pediatric): Secondary | ICD-10-CM | POA: Diagnosis not present

## 2020-09-30 DIAGNOSIS — G4733 Obstructive sleep apnea (adult) (pediatric): Secondary | ICD-10-CM | POA: Diagnosis not present

## 2020-10-20 DIAGNOSIS — Z23 Encounter for immunization: Secondary | ICD-10-CM | POA: Diagnosis not present

## 2020-10-30 DIAGNOSIS — G4733 Obstructive sleep apnea (adult) (pediatric): Secondary | ICD-10-CM | POA: Diagnosis not present

## 2020-11-09 DIAGNOSIS — I10 Essential (primary) hypertension: Secondary | ICD-10-CM | POA: Diagnosis not present

## 2020-11-09 DIAGNOSIS — E6609 Other obesity due to excess calories: Secondary | ICD-10-CM | POA: Diagnosis not present

## 2020-11-09 DIAGNOSIS — G4730 Sleep apnea, unspecified: Secondary | ICD-10-CM | POA: Diagnosis not present

## 2020-11-10 DIAGNOSIS — I10 Essential (primary) hypertension: Secondary | ICD-10-CM | POA: Diagnosis not present

## 2020-11-10 DIAGNOSIS — G4700 Insomnia, unspecified: Secondary | ICD-10-CM | POA: Diagnosis not present

## 2020-11-10 DIAGNOSIS — F064 Anxiety disorder due to known physiological condition: Secondary | ICD-10-CM | POA: Diagnosis not present

## 2020-11-30 DIAGNOSIS — G4733 Obstructive sleep apnea (adult) (pediatric): Secondary | ICD-10-CM | POA: Diagnosis not present

## 2020-12-30 DIAGNOSIS — G4733 Obstructive sleep apnea (adult) (pediatric): Secondary | ICD-10-CM | POA: Diagnosis not present

## 2021-01-30 DIAGNOSIS — G4733 Obstructive sleep apnea (adult) (pediatric): Secondary | ICD-10-CM | POA: Diagnosis not present

## 2021-04-04 DIAGNOSIS — G4733 Obstructive sleep apnea (adult) (pediatric): Secondary | ICD-10-CM | POA: Diagnosis not present

## 2021-04-14 DIAGNOSIS — Z20822 Contact with and (suspected) exposure to covid-19: Secondary | ICD-10-CM | POA: Diagnosis not present

## 2021-06-02 DIAGNOSIS — E6609 Other obesity due to excess calories: Secondary | ICD-10-CM | POA: Diagnosis not present

## 2021-06-02 DIAGNOSIS — I10 Essential (primary) hypertension: Secondary | ICD-10-CM | POA: Diagnosis not present

## 2021-06-02 DIAGNOSIS — E7800 Pure hypercholesterolemia, unspecified: Secondary | ICD-10-CM | POA: Diagnosis not present

## 2021-06-08 ENCOUNTER — Other Ambulatory Visit (HOSPITAL_COMMUNITY): Payer: Self-pay | Admitting: Family Medicine

## 2021-06-08 DIAGNOSIS — Z1231 Encounter for screening mammogram for malignant neoplasm of breast: Secondary | ICD-10-CM

## 2021-06-12 ENCOUNTER — Ambulatory Visit (HOSPITAL_COMMUNITY)
Admission: RE | Admit: 2021-06-12 | Discharge: 2021-06-12 | Disposition: A | Discharge status: Home / Self Care | Payer: Federal, State, Local not specified - PPO | Source: Ambulatory Visit | Attending: Family Medicine | Admitting: Family Medicine

## 2021-06-12 ENCOUNTER — Other Ambulatory Visit: Payer: Self-pay

## 2021-06-12 DIAGNOSIS — Z1231 Encounter for screening mammogram for malignant neoplasm of breast: Secondary | ICD-10-CM | POA: Insufficient documentation

## 2022-03-23 ENCOUNTER — Other Ambulatory Visit (HOSPITAL_BASED_OUTPATIENT_CLINIC_OR_DEPARTMENT_OTHER): Payer: Self-pay

## 2022-03-23 MED ORDER — SEMAGLUTIDE-WEIGHT MANAGEMENT 1.7 MG/0.75ML ~~LOC~~ SOAJ
SUBCUTANEOUS | 4 refills | Status: DC
  Filled 2022-03-23: qty 6, 56d supply, fill #0
  Filled 2022-03-28 – 2022-04-02 (×2): qty 9, 84d supply, fill #0
  Filled 2022-06-18: qty 3, 28d supply, fill #1
  Filled 2022-07-22: qty 3, 28d supply, fill #2
  Filled 2022-08-28: qty 3, 28d supply, fill #3
  Filled 2022-09-30 – 2022-11-09 (×3): qty 3, 28d supply, fill #4
  Filled 2023-01-01: qty 3, 28d supply, fill #5

## 2022-03-26 ENCOUNTER — Other Ambulatory Visit (HOSPITAL_BASED_OUTPATIENT_CLINIC_OR_DEPARTMENT_OTHER): Payer: Self-pay

## 2022-03-28 ENCOUNTER — Other Ambulatory Visit (HOSPITAL_BASED_OUTPATIENT_CLINIC_OR_DEPARTMENT_OTHER): Payer: Self-pay

## 2022-03-30 ENCOUNTER — Other Ambulatory Visit (HOSPITAL_BASED_OUTPATIENT_CLINIC_OR_DEPARTMENT_OTHER): Payer: Self-pay

## 2022-04-02 ENCOUNTER — Other Ambulatory Visit (HOSPITAL_BASED_OUTPATIENT_CLINIC_OR_DEPARTMENT_OTHER): Payer: Self-pay

## 2022-04-06 ENCOUNTER — Other Ambulatory Visit (HOSPITAL_BASED_OUTPATIENT_CLINIC_OR_DEPARTMENT_OTHER): Payer: Self-pay

## 2022-06-22 ENCOUNTER — Other Ambulatory Visit (HOSPITAL_BASED_OUTPATIENT_CLINIC_OR_DEPARTMENT_OTHER): Payer: Self-pay

## 2022-07-02 ENCOUNTER — Other Ambulatory Visit (HOSPITAL_COMMUNITY): Payer: Self-pay | Admitting: Family Medicine

## 2022-07-02 DIAGNOSIS — Z0000 Encounter for general adult medical examination without abnormal findings: Secondary | ICD-10-CM

## 2022-07-16 ENCOUNTER — Ambulatory Visit (HOSPITAL_COMMUNITY): Payer: Federal, State, Local not specified - PPO

## 2022-07-23 ENCOUNTER — Ambulatory Visit (HOSPITAL_COMMUNITY)
Admission: RE | Admit: 2022-07-23 | Discharge: 2022-07-23 | Disposition: A | Discharge status: Home / Self Care | Payer: Federal, State, Local not specified - PPO | Source: Ambulatory Visit | Attending: Family Medicine | Admitting: Family Medicine

## 2022-07-23 DIAGNOSIS — Z0000 Encounter for general adult medical examination without abnormal findings: Secondary | ICD-10-CM

## 2022-07-23 DIAGNOSIS — Z1231 Encounter for screening mammogram for malignant neoplasm of breast: Secondary | ICD-10-CM | POA: Diagnosis not present

## 2022-09-06 ENCOUNTER — Encounter (HOSPITAL_BASED_OUTPATIENT_CLINIC_OR_DEPARTMENT_OTHER): Payer: Self-pay

## 2022-09-06 ENCOUNTER — Other Ambulatory Visit (HOSPITAL_BASED_OUTPATIENT_CLINIC_OR_DEPARTMENT_OTHER): Payer: Self-pay

## 2022-10-01 ENCOUNTER — Other Ambulatory Visit (HOSPITAL_BASED_OUTPATIENT_CLINIC_OR_DEPARTMENT_OTHER): Payer: Self-pay

## 2022-10-03 ENCOUNTER — Other Ambulatory Visit (HOSPITAL_BASED_OUTPATIENT_CLINIC_OR_DEPARTMENT_OTHER): Payer: Self-pay

## 2022-10-04 ENCOUNTER — Other Ambulatory Visit (HOSPITAL_BASED_OUTPATIENT_CLINIC_OR_DEPARTMENT_OTHER): Payer: Self-pay

## 2022-10-04 ENCOUNTER — Other Ambulatory Visit: Payer: Self-pay

## 2022-10-06 ENCOUNTER — Other Ambulatory Visit (HOSPITAL_BASED_OUTPATIENT_CLINIC_OR_DEPARTMENT_OTHER): Payer: Self-pay

## 2022-10-08 ENCOUNTER — Other Ambulatory Visit (HOSPITAL_BASED_OUTPATIENT_CLINIC_OR_DEPARTMENT_OTHER): Payer: Self-pay

## 2022-10-15 ENCOUNTER — Other Ambulatory Visit (HOSPITAL_BASED_OUTPATIENT_CLINIC_OR_DEPARTMENT_OTHER): Payer: Self-pay

## 2022-10-17 ENCOUNTER — Other Ambulatory Visit (HOSPITAL_BASED_OUTPATIENT_CLINIC_OR_DEPARTMENT_OTHER): Payer: Self-pay

## 2022-10-20 ENCOUNTER — Other Ambulatory Visit (HOSPITAL_BASED_OUTPATIENT_CLINIC_OR_DEPARTMENT_OTHER): Payer: Self-pay

## 2022-10-21 ENCOUNTER — Other Ambulatory Visit (HOSPITAL_BASED_OUTPATIENT_CLINIC_OR_DEPARTMENT_OTHER): Payer: Self-pay

## 2022-10-24 ENCOUNTER — Other Ambulatory Visit (HOSPITAL_BASED_OUTPATIENT_CLINIC_OR_DEPARTMENT_OTHER): Payer: Self-pay

## 2022-10-26 ENCOUNTER — Other Ambulatory Visit (HOSPITAL_BASED_OUTPATIENT_CLINIC_OR_DEPARTMENT_OTHER): Payer: Self-pay

## 2022-10-26 MED ORDER — SEMAGLUTIDE-WEIGHT MANAGEMENT 1.7 MG/0.75ML ~~LOC~~ SOAJ
1.7000 mg | SUBCUTANEOUS | 4 refills | Status: DC
  Filled 2022-10-27 – 2022-12-02 (×4): qty 3, 28d supply, fill #0
  Filled 2023-02-09 (×2): qty 3, 28d supply, fill #1
  Filled 2023-03-11: qty 3, 28d supply, fill #2
  Filled 2023-04-13 – 2023-04-20 (×2): qty 3, 28d supply, fill #3
  Filled 2023-05-15 – 2023-05-18 (×2): qty 3, 28d supply, fill #4
  Filled 2023-06-17 – 2023-08-17 (×2): qty 3, 28d supply, fill #5

## 2022-10-27 ENCOUNTER — Other Ambulatory Visit (HOSPITAL_BASED_OUTPATIENT_CLINIC_OR_DEPARTMENT_OTHER): Payer: Self-pay

## 2022-10-28 ENCOUNTER — Other Ambulatory Visit (HOSPITAL_BASED_OUTPATIENT_CLINIC_OR_DEPARTMENT_OTHER): Payer: Self-pay

## 2022-10-29 ENCOUNTER — Other Ambulatory Visit (HOSPITAL_BASED_OUTPATIENT_CLINIC_OR_DEPARTMENT_OTHER): Payer: Self-pay

## 2022-10-30 ENCOUNTER — Other Ambulatory Visit (HOSPITAL_BASED_OUTPATIENT_CLINIC_OR_DEPARTMENT_OTHER): Payer: Self-pay

## 2022-10-31 ENCOUNTER — Other Ambulatory Visit (HOSPITAL_BASED_OUTPATIENT_CLINIC_OR_DEPARTMENT_OTHER): Payer: Self-pay

## 2022-11-09 ENCOUNTER — Other Ambulatory Visit (HOSPITAL_BASED_OUTPATIENT_CLINIC_OR_DEPARTMENT_OTHER): Payer: Self-pay

## 2022-11-23 ENCOUNTER — Other Ambulatory Visit (HOSPITAL_BASED_OUTPATIENT_CLINIC_OR_DEPARTMENT_OTHER): Payer: Self-pay

## 2022-11-24 ENCOUNTER — Other Ambulatory Visit (HOSPITAL_BASED_OUTPATIENT_CLINIC_OR_DEPARTMENT_OTHER): Payer: Self-pay

## 2022-12-02 ENCOUNTER — Other Ambulatory Visit (HOSPITAL_BASED_OUTPATIENT_CLINIC_OR_DEPARTMENT_OTHER): Payer: Self-pay

## 2022-12-08 ENCOUNTER — Other Ambulatory Visit (HOSPITAL_BASED_OUTPATIENT_CLINIC_OR_DEPARTMENT_OTHER): Payer: Self-pay

## 2023-01-14 ENCOUNTER — Other Ambulatory Visit (HOSPITAL_BASED_OUTPATIENT_CLINIC_OR_DEPARTMENT_OTHER): Payer: Self-pay

## 2023-02-09 ENCOUNTER — Other Ambulatory Visit (HOSPITAL_BASED_OUTPATIENT_CLINIC_OR_DEPARTMENT_OTHER): Payer: Self-pay

## 2023-02-16 ENCOUNTER — Other Ambulatory Visit (HOSPITAL_BASED_OUTPATIENT_CLINIC_OR_DEPARTMENT_OTHER): Payer: Self-pay

## 2023-02-25 ENCOUNTER — Other Ambulatory Visit (HOSPITAL_BASED_OUTPATIENT_CLINIC_OR_DEPARTMENT_OTHER): Payer: Self-pay

## 2023-02-25 MED ORDER — SEMAGLUTIDE-WEIGHT MANAGEMENT 1.7 MG/0.75ML ~~LOC~~ SOAJ
1.7000 mg | SUBCUTANEOUS | 4 refills | Status: DC
  Filled 2023-02-25 – 2023-04-13 (×3): qty 9, 84d supply, fill #0
  Filled 2023-04-20: qty 3, 28d supply, fill #0
  Filled 2023-06-15 – 2023-06-29 (×3): qty 9, 84d supply, fill #0

## 2023-04-13 ENCOUNTER — Other Ambulatory Visit (HOSPITAL_BASED_OUTPATIENT_CLINIC_OR_DEPARTMENT_OTHER): Payer: Self-pay

## 2023-04-20 ENCOUNTER — Other Ambulatory Visit (HOSPITAL_BASED_OUTPATIENT_CLINIC_OR_DEPARTMENT_OTHER): Payer: Self-pay

## 2023-04-22 ENCOUNTER — Other Ambulatory Visit: Payer: Self-pay

## 2023-04-22 ENCOUNTER — Other Ambulatory Visit (HOSPITAL_BASED_OUTPATIENT_CLINIC_OR_DEPARTMENT_OTHER): Payer: Self-pay

## 2023-05-18 ENCOUNTER — Other Ambulatory Visit (HOSPITAL_BASED_OUTPATIENT_CLINIC_OR_DEPARTMENT_OTHER): Payer: Self-pay

## 2023-05-20 ENCOUNTER — Other Ambulatory Visit: Payer: Self-pay

## 2023-06-11 ENCOUNTER — Other Ambulatory Visit (HOSPITAL_COMMUNITY)
Admission: RE | Admit: 2023-06-11 | Discharge: 2023-06-11 | Disposition: A | Discharge status: Home / Self Care | Payer: Federal, State, Local not specified - PPO | Source: Ambulatory Visit | Attending: Family Medicine | Admitting: Family Medicine

## 2023-06-11 ENCOUNTER — Other Ambulatory Visit: Payer: Self-pay | Admitting: Family Medicine

## 2023-06-11 ENCOUNTER — Other Ambulatory Visit (HOSPITAL_BASED_OUTPATIENT_CLINIC_OR_DEPARTMENT_OTHER): Payer: Self-pay

## 2023-06-11 DIAGNOSIS — N898 Other specified noninflammatory disorders of vagina: Secondary | ICD-10-CM | POA: Diagnosis present

## 2023-06-11 DIAGNOSIS — Z113 Encounter for screening for infections with a predominantly sexual mode of transmission: Secondary | ICD-10-CM | POA: Diagnosis present

## 2023-06-11 DIAGNOSIS — Z124 Encounter for screening for malignant neoplasm of cervix: Secondary | ICD-10-CM | POA: Diagnosis present

## 2023-06-11 MED ORDER — WEGOVY 1 MG/0.5ML ~~LOC~~ SOAJ
1.0000 mg | SUBCUTANEOUS | 1 refills | Status: DC
  Filled 2023-06-11: qty 2, 28d supply, fill #0
  Filled 2023-07-02: qty 2, 28d supply, fill #1

## 2023-06-13 LAB — MOLECULAR ANCILLARY ONLY
Bacterial Vaginitis (gardnerella): POSITIVE — AB
Candida Glabrata: NEGATIVE
Candida Vaginitis: NEGATIVE
Chlamydia: NEGATIVE
Comment: NEGATIVE
Comment: NEGATIVE
Comment: NEGATIVE
Comment: NEGATIVE
Comment: NEGATIVE
Comment: NORMAL
Neisseria Gonorrhea: NEGATIVE
Trichomonas: NEGATIVE

## 2023-06-13 LAB — CYTOLOGY - PAP
Adequacy: ABSENT
Diagnosis: NEGATIVE

## 2023-06-15 ENCOUNTER — Other Ambulatory Visit (HOSPITAL_BASED_OUTPATIENT_CLINIC_OR_DEPARTMENT_OTHER): Payer: Self-pay

## 2023-06-17 ENCOUNTER — Other Ambulatory Visit (HOSPITAL_BASED_OUTPATIENT_CLINIC_OR_DEPARTMENT_OTHER): Payer: Self-pay

## 2023-06-18 ENCOUNTER — Other Ambulatory Visit (HOSPITAL_BASED_OUTPATIENT_CLINIC_OR_DEPARTMENT_OTHER): Payer: Self-pay

## 2023-06-29 ENCOUNTER — Other Ambulatory Visit (HOSPITAL_BASED_OUTPATIENT_CLINIC_OR_DEPARTMENT_OTHER): Payer: Self-pay

## 2023-07-02 ENCOUNTER — Other Ambulatory Visit (HOSPITAL_BASED_OUTPATIENT_CLINIC_OR_DEPARTMENT_OTHER): Payer: Self-pay

## 2023-07-09 IMAGING — MG MM DIGITAL SCREENING BILAT W/ TOMO AND CAD
8 series · 8 of 24 positions shown · non-contrast
Comparison: Previous exam(s).

CLINICAL DATA: Screening.

EXAM:
DIGITAL SCREENING BILATERAL MAMMOGRAM WITH TOMOSYNTHESIS AND CAD
TECHNIQUE: Bilateral screening digital craniocaudal and mediolateral oblique
mammograms were obtained. Bilateral screening digital breast
tomosynthesis was performed. The images were evaluated with
computer-aided detection.

[L MLO synth-2D]
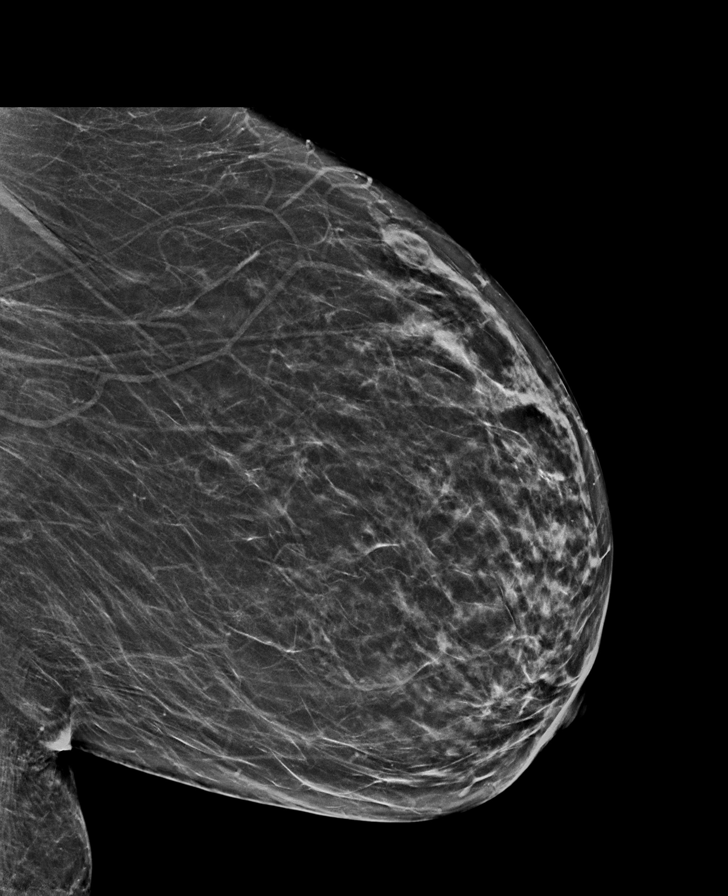

[R MLO synth-2D]
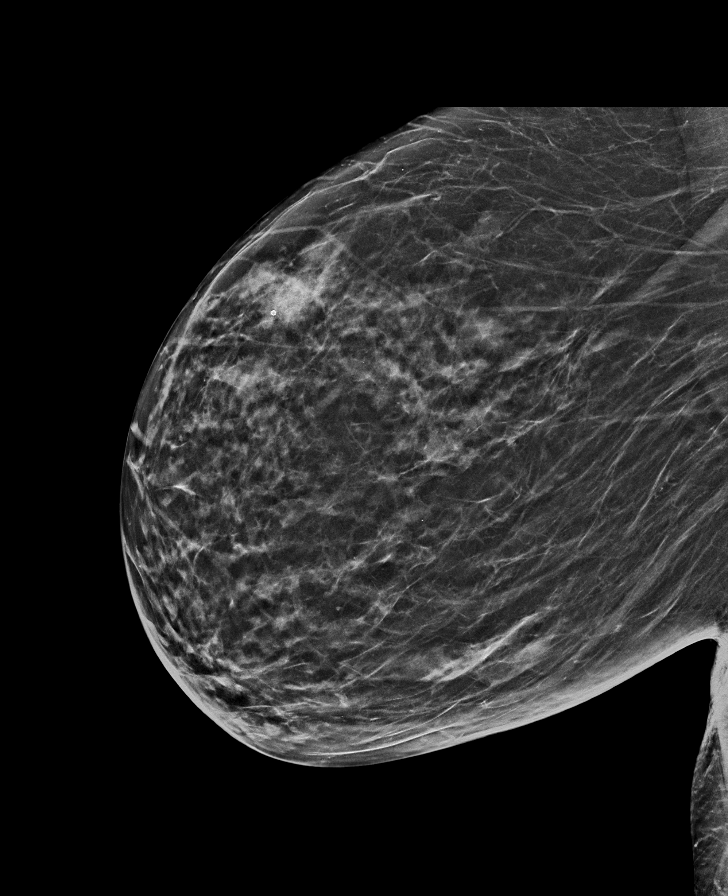

[R CC synth-2D]
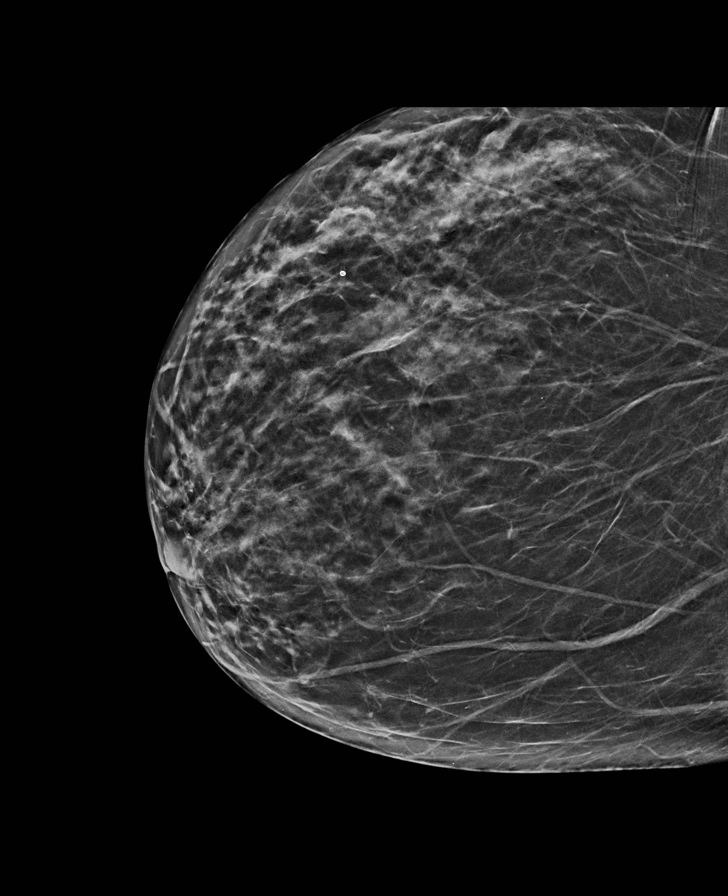

[L CC synth-2D]
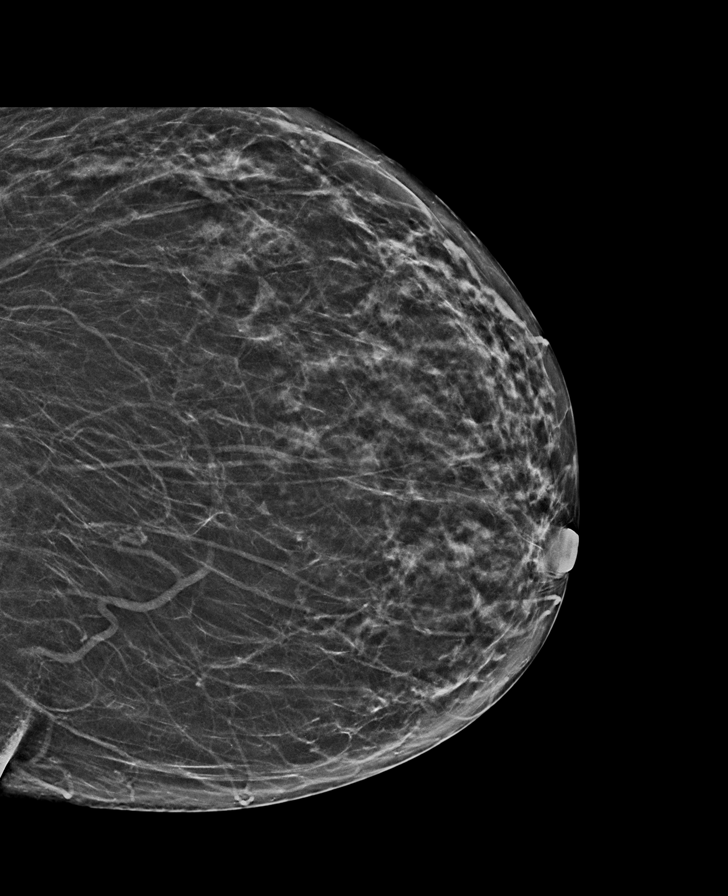

[R MLO tomo · tomo slice 31/62.0]
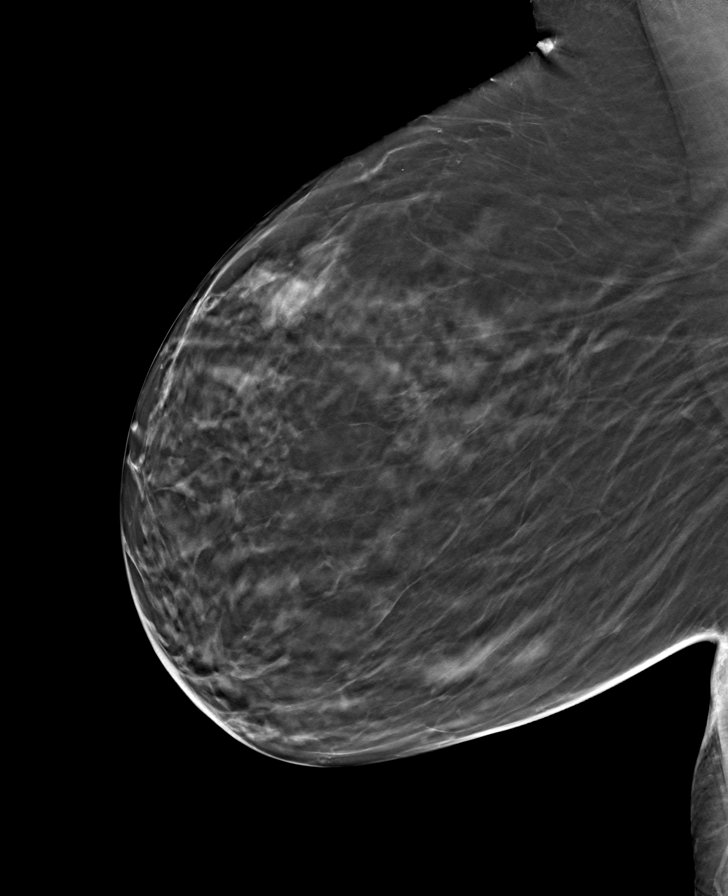

[L MLO tomo · tomo slice 32/63.0]
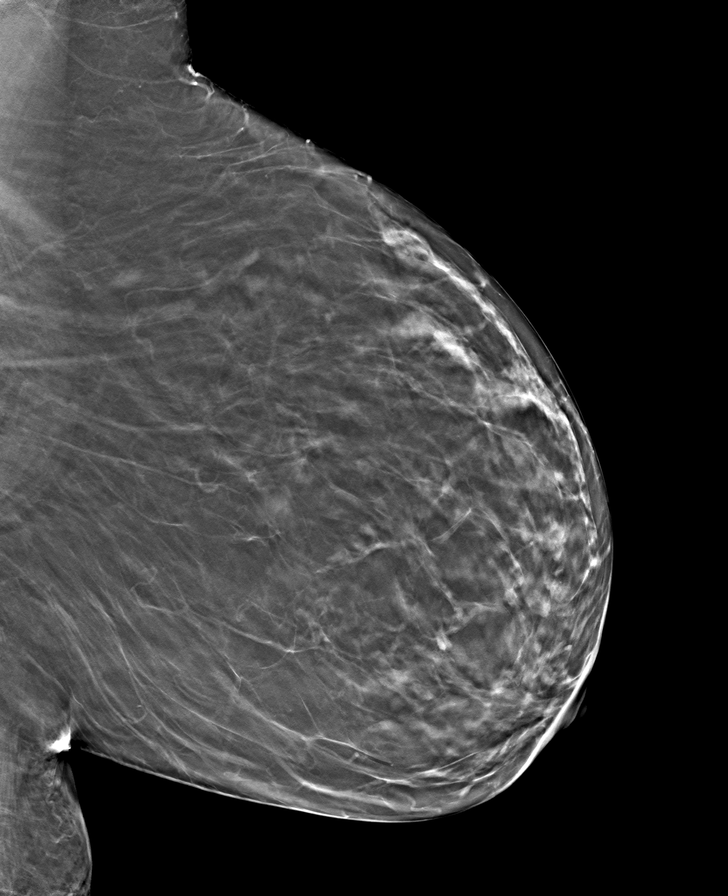

[R CC tomo · tomo slice 29/57.0]
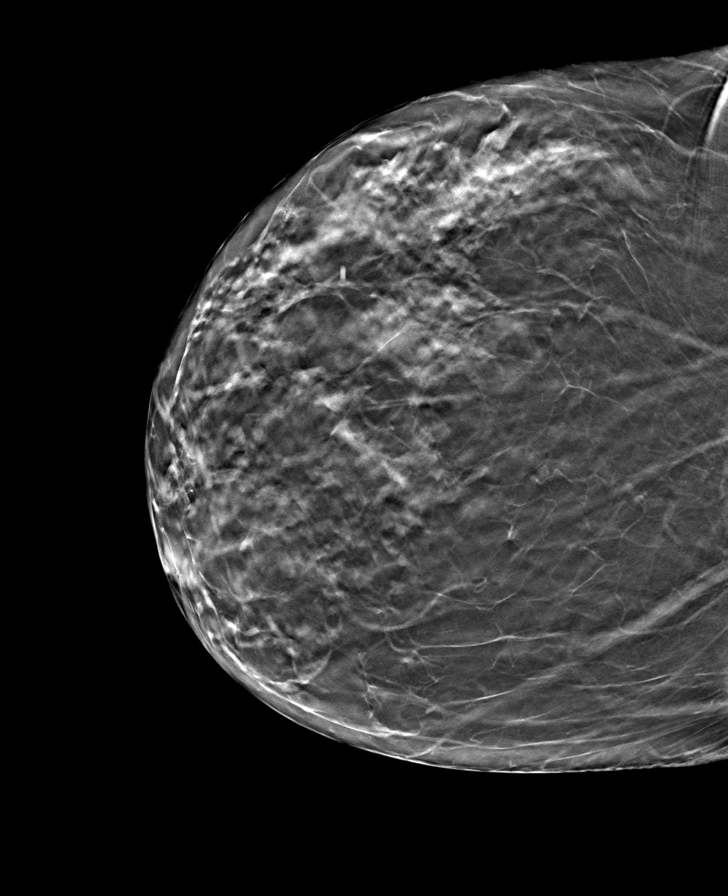

[L CC tomo · tomo slice 28/55.0]
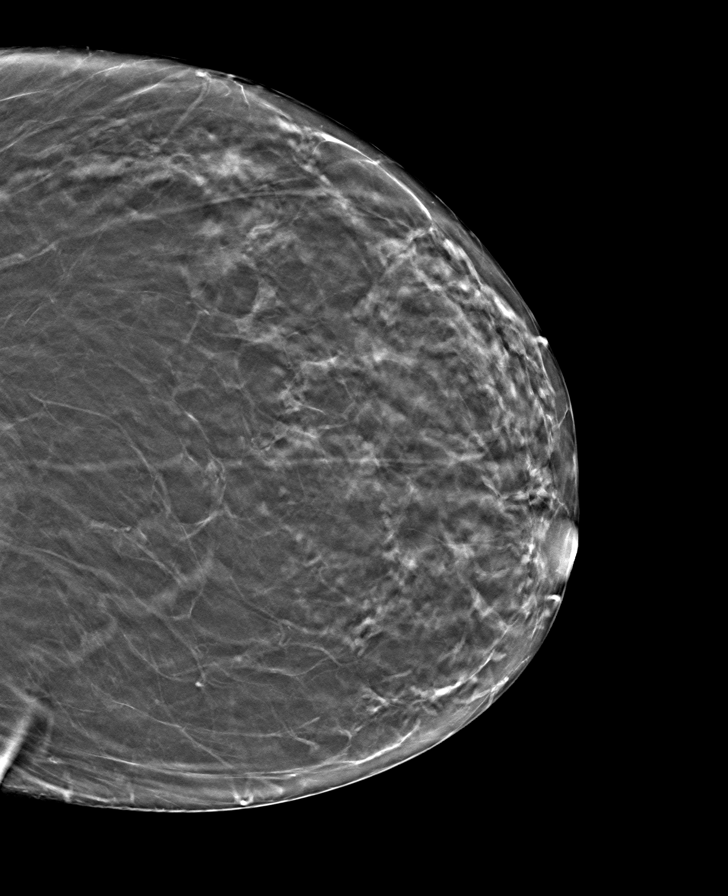

[8 of 24 positions shown; findings below may reference images not displayed]

ACR Breast Density Category b: There are scattered areas of
fibroglandular density.
FINDINGS: There are no findings suspicious for malignancy.
IMPRESSION: No mammographic evidence of malignancy. A result letter of this
screening mammogram will be mailed directly to the patient.

RECOMMENDATION:
Screening mammogram in one year. (Code:51-O-LD2)

BI-RADS CATEGORY  1: Negative.

## 2023-07-11 ENCOUNTER — Encounter: Payer: Self-pay | Admitting: *Deleted

## 2023-07-22 ENCOUNTER — Inpatient Hospital Stay (HOSPITAL_COMMUNITY): Admission: RE | Admit: 2023-07-22 | Payer: Federal, State, Local not specified - PPO | Source: Ambulatory Visit

## 2023-07-22 DIAGNOSIS — Z1231 Encounter for screening mammogram for malignant neoplasm of breast: Secondary | ICD-10-CM

## 2023-07-23 ENCOUNTER — Other Ambulatory Visit (HOSPITAL_COMMUNITY): Payer: Self-pay | Admitting: Family Medicine

## 2023-07-23 DIAGNOSIS — Z1231 Encounter for screening mammogram for malignant neoplasm of breast: Secondary | ICD-10-CM

## 2023-08-02 ENCOUNTER — Encounter (HOSPITAL_COMMUNITY): Payer: Self-pay

## 2023-08-02 ENCOUNTER — Ambulatory Visit (HOSPITAL_COMMUNITY)
Admission: RE | Admit: 2023-08-02 | Discharge: 2023-08-02 | Disposition: A | Discharge status: Home / Self Care | Payer: Federal, State, Local not specified - PPO | Source: Ambulatory Visit | Attending: Diagnostic Radiology | Admitting: Diagnostic Radiology

## 2023-08-02 DIAGNOSIS — Z1231 Encounter for screening mammogram for malignant neoplasm of breast: Secondary | ICD-10-CM | POA: Diagnosis present

## 2023-08-17 ENCOUNTER — Other Ambulatory Visit (HOSPITAL_BASED_OUTPATIENT_CLINIC_OR_DEPARTMENT_OTHER): Payer: Self-pay

## 2023-08-19 ENCOUNTER — Other Ambulatory Visit: Payer: Self-pay

## 2023-08-19 ENCOUNTER — Other Ambulatory Visit (HOSPITAL_BASED_OUTPATIENT_CLINIC_OR_DEPARTMENT_OTHER): Payer: Self-pay

## 2023-08-30 ENCOUNTER — Other Ambulatory Visit (HOSPITAL_BASED_OUTPATIENT_CLINIC_OR_DEPARTMENT_OTHER): Payer: Self-pay

## 2023-08-30 MED ORDER — WEGOVY 1.7 MG/0.75ML ~~LOC~~ SOAJ
1.7000 mg | SUBCUTANEOUS | 4 refills | Status: AC
  Filled 2023-08-30: qty 9, 84d supply, fill #0
  Filled 2023-09-05: qty 9, fill #0
  Filled 2023-09-09: qty 3, 28d supply, fill #0
  Filled 2023-10-27 – 2023-11-15 (×3): qty 3, 28d supply, fill #1
  Filled 2023-12-16: qty 3, 28d supply, fill #2

## 2023-09-05 ENCOUNTER — Other Ambulatory Visit (HOSPITAL_BASED_OUTPATIENT_CLINIC_OR_DEPARTMENT_OTHER): Payer: Self-pay

## 2023-09-09 ENCOUNTER — Other Ambulatory Visit (HOSPITAL_BASED_OUTPATIENT_CLINIC_OR_DEPARTMENT_OTHER): Payer: Self-pay

## 2023-09-13 ENCOUNTER — Other Ambulatory Visit (HOSPITAL_BASED_OUTPATIENT_CLINIC_OR_DEPARTMENT_OTHER): Payer: Self-pay

## 2023-09-21 NOTE — Progress Notes (Unsigned)
 GI Office Note    Referring Provider: Renaye Rakers, MD Primary Care Physician:  Renaye Rakers, MD  Primary Gastroenterologist:  Chief Complaint   No chief complaint on file.    History of Present Illness   Courtney Curry is a 58 y.o. female presenting today for high risk screening colonoscopy. FH mother and sister both with colon polyps. Maternal GF died with colon cancer at advanced age.       Colonoscopy 07/2018: -entire colon normal -next colonoscopy 5 years for screening (FH colon polyps)   Medications   Current Outpatient Medications  Medication Sig Dispense Refill   Cyanocobalamin (HM SUPER VITAMIN B12) 2500 MCG CHEW Chew 1 each by mouth daily.      lisinopril-hydrochlorothiazide (PRINZIDE,ZESTORETIC) 20-12.5 MG tablet Take 1 tablet by mouth daily.     Nutritional Supp - Diet Aids (DIETERS DETOX PO) Take 1 tablet by mouth 2 (two) times daily.      Semaglutide-Weight Management (WEGOVY) 1 MG/0.5ML SOAJ Inject 1 mg into the skin every 7 (seven) days. 2 mL 1   Semaglutide-Weight Management (WEGOVY) 1.7 MG/0.75ML SOAJ Inject 1.7 mg into the skin every 7 (seven) days. 9 mL 4   Semaglutide-Weight Management 1.7 MG/0.75ML SOAJ Inject 1.7 milligrams under the skin once weekly. 9 mL 4   Semaglutide-Weight Management 1.7 MG/0.75ML SOAJ Inject 1.7 mg into the skin once a week. 9 mL 4   Semaglutide-Weight Management 1.7 MG/0.75ML SOAJ Inject 1.7 mg into the skin once a week. 9 mL 4   No current facility-administered medications for this visit.    Allergies   Allergies as of 09/23/2023 - Review Complete 07/03/2018  Allergen Reaction Noted   Penicillins Rash 06/09/2018    Past Medical History   Past Medical History:  Diagnosis Date   Anxiety    Hypertension    Insomnia    Sleep apnea    "too mild for CPAP'    Past Surgical History   Past Surgical History:  Procedure Laterality Date   CHOLECYSTECTOMY  2005   repaired hernia    COLONOSCOPY WITH  PROPOFOL N/A 07/03/2018   Procedure: COLONOSCOPY WITH PROPOFOL;  Surgeon: Corbin Ade, MD;  Location: [facility];  Service: Endoscopy;  Laterality: N/A;  1:45pm   GASTRIC BYPASS  2005   Gastric bypass revision  2010   TONSILLECTOMY      Past Family History   Family History  Problem Relation Age of Onset   Colon polyps Mother    Colon polyps Sister        around age 60   Colon cancer Maternal Grandfather        older than 60   Heart attack Maternal Grandfather        from chemo   Endometrial cancer Sister     Past Social History   Social History   Socioeconomic History   Marital status: Married    Spouse name: Not on file   Number of children: Not on file   Years of education: Not on file   Highest education level: Not on file  Occupational History   Not on file  Tobacco Use   Smoking status: Never   Smokeless tobacco: Never  Vaping Use   Vaping status: Never Used  Substance and Sexual Activity   Alcohol use: Yes    Comment: 2-3 TIMES A WEEK   Drug use: Never   Sexual activity: Yes    Partners: Male  Other Topics Concern  Not on file  Social History Narrative   Not on file   Social Drivers of Health   Financial Resource Strain: Not on file  Food Insecurity: Not on file  Transportation Needs: Not on file  Physical Activity: Not on file  Stress: Not on file  Social Connections: Not on file  Intimate Partner Violence: Not on file    Review of Systems   General: Negative for anorexia, weight loss, fever, chills, fatigue, weakness. Eyes: Negative for vision changes.  ENT: Negative for hoarseness, difficulty swallowing , nasal congestion. CV: Negative for chest pain, angina, palpitations, dyspnea on exertion, peripheral edema.  Respiratory: Negative for dyspnea at rest, dyspnea on exertion, cough, sputum, wheezing.  GI: See history of present illness. GU:  Negative for dysuria, hematuria, urinary incontinence, urinary frequency, nocturnal urination.   MS: Negative for joint pain, low back pain.  Derm: Negative for rash or itching.  Neuro: Negative for weakness, abnormal sensation, seizure, frequent headaches, memory loss,  confusion.  Psych: Negative for anxiety, depression, suicidal ideation, hallucinations.  Endo: Negative for unusual weight change.  Heme: Negative for bruising or bleeding. Allergy: Negative for rash or hives.  Physical Exam   There were no vitals taken for this visit.   General: Well-nourished, well-developed in no acute distress.  Head: Normocephalic, atraumatic.   Eyes: Conjunctiva pink, no icterus. Mouth: Oropharyngeal mucosa moist and pink , no lesions erythema or exudate. Neck: Supple without thyromegaly, masses, or lymphadenopathy.  Lungs: Clear to auscultation bilaterally.  Heart: Regular rate and rhythm, no murmurs rubs or gallops.  Abdomen: Bowel sounds are normal, nontender, nondistended, no hepatosplenomegaly or masses,  no abdominal bruits or hernia, no rebound or guarding.   Rectal: *** Extremities: No lower extremity edema. No clubbing or deformities.  Neuro: Alert and oriented x 4 , grossly normal neurologically.  Skin: Warm and dry, no rash or jaundice.   Psych: Alert and cooperative, normal mood and affect.  Labs   *** Imaging Studies   No results found.  Assessment       PLAN   ***   Leanna Battles. Melvyn Neth, MHS, PA-C [facility]

## 2023-09-23 ENCOUNTER — Ambulatory Visit: Admitting: Gastroenterology

## 2023-09-23 ENCOUNTER — Encounter: Payer: Self-pay | Admitting: Gastroenterology

## 2023-09-23 VITALS — BP 125/77 | HR 74 | Temp 98.1°F | Ht 65.0 in | Wt 151.8 lb

## 2023-09-23 DIAGNOSIS — K5900 Constipation, unspecified: Secondary | ICD-10-CM | POA: Diagnosis not present

## 2023-09-23 DIAGNOSIS — Z800 Family history of malignant neoplasm of digestive organs: Secondary | ICD-10-CM | POA: Diagnosis not present

## 2023-09-23 DIAGNOSIS — Z83719 Family history of colon polyps, unspecified: Secondary | ICD-10-CM | POA: Diagnosis not present

## 2023-09-23 MED ORDER — POLYETHYLENE GLYCOL 3350 17 GM/SCOOP PO POWD: ORAL | Status: AC

## 2023-09-23 NOTE — Patient Instructions (Signed)
 Start miralax two capfuls in 16 ounces of water daily until soft stool. Then continue one capful daily. If you are not having a bowel movement 4-5 days per week, please let me know.   Colonoscopy to be scheduled.

## 2023-09-24 ENCOUNTER — Telehealth: Payer: Self-pay | Admitting: *Deleted

## 2023-09-24 DIAGNOSIS — Z83719 Family history of colon polyps, unspecified: Secondary | ICD-10-CM

## 2023-09-24 DIAGNOSIS — Z800 Family history of malignant neoplasm of digestive organs: Secondary | ICD-10-CM

## 2023-09-24 MED ORDER — PEG 3350-KCL-NA BICARB-NACL 420 G PO SOLR: 4000.0000 mL | ORAL | 0 refills | Status: AC

## 2023-09-24 NOTE — Telephone Encounter (Signed)
 Spoke with pt. She has been scheduled for TCS with Dr. Jena Gauss, ASA 2 on 4/24. Aware will send all instructions to her mychart and rx for prep to [facility]

## 2023-10-24 ENCOUNTER — Ambulatory Visit (HOSPITAL_COMMUNITY)
Admission: RE | Admit: 2023-10-24 | Discharge: 2023-10-24 | Disposition: A | Discharge status: Home / Self Care | Attending: Internal Medicine | Admitting: Internal Medicine

## 2023-10-24 ENCOUNTER — Ambulatory Visit (HOSPITAL_COMMUNITY): Admitting: Anesthesiology

## 2023-10-24 ENCOUNTER — Encounter (HOSPITAL_COMMUNITY)
Admission: RE | Disposition: A | Discharge status: Home / Self Care | Payer: Self-pay | Source: Home / Self Care | Attending: Internal Medicine

## 2023-10-24 ENCOUNTER — Encounter (HOSPITAL_COMMUNITY): Payer: Self-pay | Admitting: Internal Medicine

## 2023-10-24 ENCOUNTER — Other Ambulatory Visit: Payer: Self-pay

## 2023-10-24 DIAGNOSIS — Z83719 Family history of colon polyps, unspecified: Secondary | ICD-10-CM | POA: Diagnosis not present

## 2023-10-24 DIAGNOSIS — Q438 Other specified congenital malformations of intestine: Secondary | ICD-10-CM | POA: Insufficient documentation

## 2023-10-24 DIAGNOSIS — I10 Essential (primary) hypertension: Secondary | ICD-10-CM | POA: Insufficient documentation

## 2023-10-24 DIAGNOSIS — D120 Benign neoplasm of cecum: Secondary | ICD-10-CM | POA: Insufficient documentation

## 2023-10-24 DIAGNOSIS — Z1211 Encounter for screening for malignant neoplasm of colon: Secondary | ICD-10-CM | POA: Insufficient documentation

## 2023-10-24 HISTORY — PX: COLONOSCOPY: SHX5424

## 2023-10-24 HISTORY — DX: Other specified postprocedural states: Z98.890

## 2023-10-24 SURGERY — COLONOSCOPY
Anesthesia: General

## 2023-10-24 MED ORDER — LACTATED RINGERS IV SOLN: INTRAVENOUS | Status: DC | PRN

## 2023-10-24 MED ORDER — PROPOFOL 500 MG/50ML IV EMUL
INTRAVENOUS | Status: DC | PRN
  Administered 2023-10-24: 150 ug/kg/min via INTRAVENOUS

## 2023-10-24 MED ORDER — PROPOFOL 10 MG/ML IV BOLUS
INTRAVENOUS | Status: DC | PRN
  Administered 2023-10-24: 30 mg via INTRAVENOUS
  Administered 2023-10-24: 70 mg via INTRAVENOUS
  Administered 2023-10-24: 30 mg via INTRAVENOUS

## 2023-10-24 MED ORDER — ONDANSETRON HCL 4 MG/2ML IJ SOLN
INTRAMUSCULAR | Status: AC
  Filled 2023-10-24: qty 2

## 2023-10-24 MED ORDER — LIDOCAINE 2% (20 MG/ML) 5 ML SYRINGE
INTRAMUSCULAR | Status: AC
  Filled 2023-10-24: qty 5

## 2023-10-24 MED ORDER — LIDOCAINE 2% (20 MG/ML) 5 ML SYRINGE
INTRAMUSCULAR | Status: DC | PRN
  Administered 2023-10-24: 60 mg via INTRAVENOUS

## 2023-10-24 MED ORDER — PHENYLEPHRINE 80 MCG/ML (10ML) SYRINGE FOR IV PUSH (FOR BLOOD PRESSURE SUPPORT)
PREFILLED_SYRINGE | INTRAVENOUS | Status: DC | PRN
  Administered 2023-10-24: 80 ug via INTRAVENOUS
  Administered 2023-10-24: 160 ug via INTRAVENOUS

## 2023-10-24 NOTE — H&P (Signed)
 @LOGO @   Primary Care Physician:  Jonathon Neighbors, MD Primary Gastroenterologist:  Dr. Riley Cheadle  Pre-Procedure History & Physical: HPI:  Courtney Curry is a 58 y.o. female here for high risk screening colonoscopy.  Multiple first-degree relatives with colon polyps.  Negative colonoscopy 2020.  Past Medical History:  Diagnosis Date   Anxiety    Hypertension    Insomnia    PONV (postoperative nausea and vomiting)    Sleep apnea    "too mild for CPAP'    Past Surgical History:  Procedure Laterality Date   CHOLECYSTECTOMY  2005   repaired hernia    COLONOSCOPY WITH PROPOFOL  N/A 07/03/2018   Procedure: COLONOSCOPY WITH PROPOFOL ;  Surgeon: Suzette Espy, MD;  Location: [facility];  Service: Endoscopy;  Laterality: N/A;  1:45pm   GASTRIC BYPASS  2005   Gastric bypass revision  2010   TONSILLECTOMY      Prior to Admission medications   Medication Sig Start Date End Date Taking? Authorizing Provider  Cyanocobalamin  (HM SUPER VITAMIN B12) 2500 MCG CHEW Chew 1 each by mouth daily.    Yes [provider]  polyethylene glycol powder (GLYCOLAX /MIRALAX ) 17 GM/SCOOP powder Take two capfuls in 16 ounces of water daily until soft stool, then continue one capful daily. 09/23/23  Yes Lanney Pitts, PA-C  valsartan-hydrochlorothiazide (DIOVAN-HCT) 80-12.5 MG tablet Take 1 tablet by mouth daily. 08/24/23  Yes [provider]  polyethylene glycol-electrolytes (NULYTELY ) 420 g solution Take 4,000 mLs by mouth as directed. 09/24/23   Sean Malinowski, Windsor Hatcher, MD  Semaglutide -Weight Management (WEGOVY ) 1.7 MG/0.75ML SOAJ Inject 1.7 mg into the skin every 7 (seven) days. Patient taking differently: Inject 1.7 mg into the skin every 30 (thirty) days. 08/30/23       Allergies as of 09/24/2023 - Review Complete 09/23/2023  Allergen Reaction Noted   Penicillins Rash 06/09/2018    Family History  Problem Relation Age of Onset   Colon polyps Mother    Colon polyps Sister        around age  107   Colon cancer Maternal Grandfather        older than 60   Heart attack Maternal Grandfather        from chemo   Endometrial cancer Sister     Social History   Socioeconomic History   Marital status: Married    Spouse name: Not on file   Number of children: Not on file   Years of education: Not on file   Highest education level: Not on file  Occupational History   Not on file  Tobacco Use   Smoking status: Never   Smokeless tobacco: Never  Vaping Use   Vaping status: Never Used  Substance and Sexual Activity   Alcohol use: Yes    Comment: 2-3 TIMES A WEEK   Drug use: Never   Sexual activity: Yes    Partners: Male  Other Topics Concern   Not on file  Social History Narrative   Not on file   Social Drivers of Health   Financial Resource Strain: Not on file  Food Insecurity: Not on file  Transportation Needs: Not on file  Physical Activity: Not on file  Stress: Not on file  Social Connections: Not on file  Intimate Partner Violence: Not on file    Review of Systems: See HPI, otherwise negative ROS  Physical Exam: BP 125/80   Pulse 70   Temp 97.9 F (36.6 C) (Oral)   Resp 20  Ht 5\' 5"  (1.651 m)   Wt 68 kg   SpO2 99%   BMI 24.96 kg/m  General:   Alert,  Well-developed, well-nourished, pleasant and cooperative in NAD Neck:  Supple; no masses or thyromegaly. No significant cervical adenopathy. Lungs:  Clear throughout to auscultation.   No wheezes, crackles, or rhonchi. No acute distress. Heart:  Regular rate and rhythm; no murmurs, clicks, rubs,  or gallops. Abdomen: Non-distended, normal bowel sounds.  Soft and nontender without appreciable mass or hepatosplenomegaly.   Impression/Plan: 58 year old lady here for high risk screening colonoscopy.  Positive family history colon polyps and multiple first-degree relatives. The risks, benefits, limitations, alternatives and imponderables have been reviewed with the patient. Questions have been answered. All  parties are agreeable.       Notice: This dictation was prepared with Dragon dictation along with smaller phrase technology. Any transcriptional errors that result from this process are unintentional and may not be corrected upon review.

## 2023-10-24 NOTE — Transfer of Care (Signed)
 Immediate Anesthesia Transfer of Care Note  Patient: Courtney Curry  Procedure(s) Performed: COLONOSCOPY  Patient Location: Endoscopy Unit  Anesthesia Type:General  Level of Consciousness: drowsy and patient cooperative  Airway & Oxygen Therapy: Patient Spontanous Breathing  Post-op Assessment: Report given to RN and Post -op Vital signs reviewed and stable  Post vital signs: Reviewed and stable  Last Vitals:  Vitals Value Taken Time  BP 155/80 10/24/23 1235  Temp 36.7 C 10/24/23 1235  Pulse 62 10/24/23 1235  Resp 20 10/24/23 1235  SpO2 100 % 10/24/23 1235    Last Pain:  Vitals:   10/24/23 1235  TempSrc: Oral  PainSc: 0-No pain      Patients Stated Pain Goal: 6 (10/24/23 1133)  Complications: No notable events documented.

## 2023-10-24 NOTE — Discharge Instructions (Addendum)
  Colonoscopy Discharge Instructions  Read the instructions outlined below and refer to this sheet in the next few weeks. These discharge instructions provide you with general information on caring for yourself after you leave the hospital. Your doctor may also give you specific instructions. While your treatment has been planned according to the most current medical practices available, unavoidable complications occasionally occur. If you have any problems or questions after discharge, call Dr. Riley Cheadle at [phone]. ACTIVITY You may resume your regular activity, but move at a slower pace for the next 24 hours.  Take frequent rest periods for the next 24 hours.  Walking will help get rid of the air and reduce the bloated feeling in your belly (abdomen).  No driving for 24 hours (because of the medicine (anesthesia) used during the test).   Do not sign any important legal documents or operate any machinery for 24 hours (because of the anesthesia used during the test).  NUTRITION Drink plenty of fluids.  You may resume your normal diet as instructed by your doctor.  Begin with a light meal and progress to your normal diet. Heavy or fried foods are harder to digest and may make you feel sick to your stomach (nauseated).  Avoid alcoholic beverages for 24 hours or as instructed.  MEDICATIONS You may resume your normal medications unless your doctor tells you otherwise.  WHAT YOU CAN EXPECT TODAY Some feelings of bloating in the abdomen.  Passage of more gas than usual.  Spotting of blood in your stool or on the toilet paper.  IF YOU HAD POLYPS REMOVED DURING THE COLONOSCOPY: No aspirin products for 7 days or as instructed.  No alcohol for 7 days or as instructed.  Eat a soft diet for the next 24 hours.  FINDING OUT THE RESULTS OF YOUR TEST Not all test results are available during your visit. If your test results are not back during the visit, make an appointment with your caregiver to find out the  results. Do not assume everything is normal if you have not heard from your caregiver or the medical facility. It is important for you to follow up on all of your test results.  SEEK IMMEDIATE MEDICAL ATTENTION IF: You have more than a spotting of blood in your stool.  Your belly is swollen (abdominal distention).  You are nauseated or vomiting.  You have a temperature over 101.  You have abdominal pain or discomfort that is severe or gets worse throughout the day.      1 polyp found and removed today  Further recommendations to follow pending review of pathology report

## 2023-10-24 NOTE — Op Note (Signed)
 [facility] Patient Name: Courtney Curry Procedure Date: 10/24/2023 10:48 AM MRN: [id number] Date of Birth: August 14, 1965 Attending MD: Gemma Kelp , MD, [id number] CSN: [id number] Age: 58 Admit Type: Outpatient Procedure:                Colonoscopy Indications:              Colon cancer screening in patient at increased                            risk: Family history of 1st-degree relative with                            colon polyps Providers:                Gemma Kelp, MD, Willena Harp, [place]                            Wilson, Technician, Theola Fitch Referring MD:             Gemma Kelp, MD Medicines:                Propofol  per Anesthesia Complications:            No immediate complications. Estimated Blood Loss:     Estimated blood loss was minimal. Procedure:                Pre-Anesthesia Assessment:                           - Prior to the procedure, a History and Physical                            was performed, and patient medications and                            allergies were reviewed. The patient's tolerance of                            previous anesthesia was also reviewed. The risks                            and benefits of the procedure and the sedation                            options and risks were discussed with the patient.                            All questions were answered, and informed consent                            was obtained. Prior Anticoagulants: The patient has                            taken no anticoagulant or antiplatelet agents. ASA  Grade Assessment: II - A patient with mild systemic                            disease. After reviewing the risks and benefits,                            the patient was deemed in satisfactory condition to                            undergo the procedure.                           After obtaining informed consent, the colonoscope                             was passed under direct vision. Throughout the                            procedure, the patient's blood pressure, pulse, and                            oxygen saturations were monitored continuously. The                            [id number]) scope was introduced through the                            anus and advanced to the the cecum, identified by                            appendiceal orifice and ileocecal valve. The                            colonoscopy was performed without difficulty. The                            patient tolerated the procedure well. The quality                            of the bowel preparation was adequate. The                            ileocecal valve, appendiceal orifice, and rectum                            were photographed. The quality of the bowel                            preparation was adequate. Scope In: 12:05:15 PM Scope Out: 12:30:09 PM Scope Withdrawal Time: 0 hours 6 minutes 44 seconds  Total Procedure Duration: 0 hours 24 minutes 54 seconds  Findings:      The perianal and digital rectal examinations were normal. Redundant and       elongated colon.      A  5 mm polyp was found in the ileocecal valve. The polyp was sessile.       The polyp was removed with a cold snare. Resection and retrieval were       complete. Estimated blood loss was minimal.      The exam was otherwise without abnormality on direct and retroflexion       views. Impression:               - One 5 mm polyp at the ileocecal valve, removed                            with a cold snare. Resected and retrieved.                           - The examination was otherwise normal on direct                            and retroflexion views. Moderate Sedation:      Moderate (conscious) sedation was personally administered by an       anesthesia professional. The following parameters were monitored: oxygen       saturation, heart rate, blood pressure,  respiratory rate, EKG, adequacy       of pulmonary ventilation, and response to care. Recommendation:           - Patient has a contact number available for                            emergencies. The signs and symptoms of potential                            delayed complications were discussed with the                            patient. Return to normal activities tomorrow.                            Written discharge instructions were provided to the                            patient.                           - Advance diet as tolerated.                           - Continue present medications.                           - Repeat colonoscopy date to be determined after                            pending pathology results are reviewed for                            surveillance.                           -  Return to GI office (date not yet determined). Procedure Code(s):        --- Professional ---                           [phone], Colonoscopy, flexible; with removal of                            tumor(s), polyp(s), or other lesion(s) by snare                            technique Diagnosis Code(s):        --- Professional ---                           Z83.71, Family history of colonic polyps                           D12.0, Benign neoplasm of cecum CPT copyright 2022 [employer]. All rights reserved. The codes documented in this report are preliminary and upon coder review may  be revised to meet current compliance requirements. Windsor Hatcher. Sheccid Lahmann, MD Gemma Kelp, MD 10/24/2023 12:39:46 PM This report has been signed electronically. Number of Addenda: 0

## 2023-10-24 NOTE — Anesthesia Preprocedure Evaluation (Signed)
 Anesthesia Evaluation  Patient identified by MRN, date of birth, ID band Patient awake    Reviewed: Allergy & Precautions, H&P , NPO status , Patient's Chart, lab work & pertinent test results, reviewed documented beta blocker date and time   History of Anesthesia Complications (+) PONV and history of anesthetic complications  Airway Mallampati: II  TM Distance: >3 FB Neck ROM: full    Dental no notable dental hx.    Pulmonary sleep apnea    Pulmonary exam normal breath sounds clear to auscultation       Cardiovascular Exercise Tolerance: Good hypertension,  Rhythm:regular Rate:Normal     Neuro/Psych   Anxiety     negative neurological ROS  negative psych ROS   GI/Hepatic negative GI ROS, Neg liver ROS,,,  Endo/Other  negative endocrine ROS    Renal/GU negative Renal ROS  negative genitourinary   Musculoskeletal   Abdominal   Peds  Hematology negative hematology ROS (+)   Anesthesia Other Findings   Reproductive/Obstetrics negative OB ROS                             Anesthesia Physical Anesthesia Plan  ASA: 2  Anesthesia Plan: General   Post-op Pain Management:    Induction:   PONV Risk Score and Plan: Propofol  infusion  Airway Management Planned:   Additional Equipment:   Intra-op Plan:   Post-operative Plan:   Informed Consent: I have reviewed the patients History and Physical, chart, labs and discussed the procedure including the risks, benefits and alternatives for the proposed anesthesia with the patient or authorized representative who has indicated his/her understanding and acceptance.     Dental Advisory Given  Plan Discussed with: CRNA  Anesthesia Plan Comments:        Anesthesia Quick Evaluation

## 2023-10-25 ENCOUNTER — Encounter (HOSPITAL_COMMUNITY): Payer: Self-pay | Admitting: Internal Medicine

## 2023-10-25 NOTE — Anesthesia Postprocedure Evaluation (Signed)
 Anesthesia Post Note  Patient: Courtney Curry  Procedure(s) Performed: COLONOSCOPY  Patient location during evaluation: Phase II Anesthesia Type: General Level of consciousness: awake Pain management: pain level controlled Vital Signs Assessment: post-procedure vital signs reviewed and stable Respiratory status: spontaneous breathing and respiratory function stable Cardiovascular status: blood pressure returned to baseline and stable Postop Assessment: no headache and no apparent nausea or vomiting Anesthetic complications: no Comments: Late entry   No notable events documented.   Last Vitals:  Vitals:   10/24/23 1133 10/24/23 1235  BP: 125/80 (!) 155/80  Pulse: 70 62  Resp: 20 20  Temp: 36.6 C 36.7 C  SpO2: 99% 100%    Last Pain:  Vitals:   10/24/23 1235  TempSrc: Oral  PainSc: 0-No pain                 Coretha Dew

## 2023-10-28 ENCOUNTER — Other Ambulatory Visit (HOSPITAL_BASED_OUTPATIENT_CLINIC_OR_DEPARTMENT_OTHER): Payer: Self-pay

## 2023-10-28 LAB — SURGICAL PATHOLOGY

## 2023-11-01 ENCOUNTER — Encounter: Payer: Self-pay | Admitting: Internal Medicine

## 2023-11-07 ENCOUNTER — Other Ambulatory Visit (HOSPITAL_BASED_OUTPATIENT_CLINIC_OR_DEPARTMENT_OTHER): Payer: Self-pay

## 2023-11-15 ENCOUNTER — Other Ambulatory Visit (HOSPITAL_BASED_OUTPATIENT_CLINIC_OR_DEPARTMENT_OTHER): Payer: Self-pay

## 2023-12-16 ENCOUNTER — Other Ambulatory Visit (HOSPITAL_BASED_OUTPATIENT_CLINIC_OR_DEPARTMENT_OTHER): Payer: Self-pay

## 2023-12-26 ENCOUNTER — Other Ambulatory Visit (HOSPITAL_BASED_OUTPATIENT_CLINIC_OR_DEPARTMENT_OTHER): Payer: Self-pay
